# Patient Record
Sex: Male | Born: 1960 | Race: White | Hispanic: No | Marital: Single | Smoking: Current every day smoker
Health system: Southern US, Community
[De-identification: ages and names within clinical notes are randomized; demographics above are authoritative.]

## PROBLEM LIST (undated history)

## (undated) DIAGNOSIS — K353 Acute appendicitis with localized peritonitis, without perforation or gangrene: Secondary | ICD-10-CM

## (undated) DIAGNOSIS — G8929 Other chronic pain: Secondary | ICD-10-CM

## (undated) DIAGNOSIS — M549 Dorsalgia, unspecified: Secondary | ICD-10-CM

## (undated) DIAGNOSIS — I341 Nonrheumatic mitral (valve) prolapse: Secondary | ICD-10-CM

## (undated) DIAGNOSIS — K852 Alcohol induced acute pancreatitis without necrosis or infection: Secondary | ICD-10-CM

## (undated) DIAGNOSIS — F1010 Alcohol abuse, uncomplicated: Secondary | ICD-10-CM

## (undated) HISTORY — DX: Acute appendicitis with localized peritonitis, without perforation or gangrene: K35.30

---

## 2017-06-02 ENCOUNTER — Emergency Department
Admission: EM | Admit: 2017-06-02 | Discharge: 2017-06-02 | Disposition: A | Discharge status: Home / Self Care | Payer: Self-pay | Attending: Emergency Medicine | Admitting: Emergency Medicine

## 2017-06-02 ENCOUNTER — Encounter: Payer: Self-pay | Admitting: Emergency Medicine

## 2017-06-02 ENCOUNTER — Emergency Department: Payer: Self-pay

## 2017-06-02 DIAGNOSIS — Y929 Unspecified place or not applicable: Secondary | ICD-10-CM | POA: Insufficient documentation

## 2017-06-02 DIAGNOSIS — Y998 Other external cause status: Secondary | ICD-10-CM | POA: Insufficient documentation

## 2017-06-02 DIAGNOSIS — Y9355 Activity, bike riding: Secondary | ICD-10-CM | POA: Insufficient documentation

## 2017-06-02 DIAGNOSIS — S4991XA Unspecified injury of right shoulder and upper arm, initial encounter: Secondary | ICD-10-CM | POA: Insufficient documentation

## 2017-06-02 DIAGNOSIS — F17200 Nicotine dependence, unspecified, uncomplicated: Secondary | ICD-10-CM | POA: Insufficient documentation

## 2017-06-02 HISTORY — DX: Dorsalgia, unspecified: M54.9

## 2017-06-02 HISTORY — DX: Other chronic pain: G89.29

## 2017-06-02 HISTORY — DX: Nonrheumatic mitral (valve) prolapse: I34.1

## 2017-06-02 MED ORDER — PREDNISONE 10 MG PO TABS: ORAL_TABLET | ORAL | 0 refills | Status: DC

## 2017-06-02 MED ORDER — KETOROLAC TROMETHAMINE 10 MG PO TABS: 10.0000 mg | ORAL_TABLET | Freq: Four times a day (QID) | ORAL | 0 refills | Status: DC | PRN
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
## 2017-06-18 ENCOUNTER — Emergency Department (HOSPITAL_COMMUNITY)
Admission: EM | Admit: 2017-06-18 | Discharge: 2017-06-19 | Disposition: A | Discharge status: Home / Self Care | Payer: No Typology Code available for payment source | Attending: Emergency Medicine | Admitting: Emergency Medicine

## 2017-06-18 ENCOUNTER — Emergency Department (HOSPITAL_COMMUNITY): Payer: No Typology Code available for payment source

## 2017-06-18 ENCOUNTER — Encounter (HOSPITAL_COMMUNITY): Payer: Self-pay | Admitting: Emergency Medicine

## 2017-06-18 DIAGNOSIS — S022XXA Fracture of nasal bones, initial encounter for closed fracture: Secondary | ICD-10-CM | POA: Diagnosis not present

## 2017-06-18 DIAGNOSIS — Y998 Other external cause status: Secondary | ICD-10-CM | POA: Insufficient documentation

## 2017-06-18 DIAGNOSIS — S0281XA Fracture of other specified skull and facial bones, right side, initial encounter for closed fracture: Secondary | ICD-10-CM | POA: Diagnosis not present

## 2017-06-18 DIAGNOSIS — S0292XA Unspecified fracture of facial bones, initial encounter for closed fracture: Secondary | ICD-10-CM

## 2017-06-18 DIAGNOSIS — S0240CA Maxillary fracture, right side, initial encounter for closed fracture: Secondary | ICD-10-CM | POA: Insufficient documentation

## 2017-06-18 DIAGNOSIS — Y92410 Unspecified street and highway as the place of occurrence of the external cause: Secondary | ICD-10-CM | POA: Diagnosis not present

## 2017-06-18 DIAGNOSIS — Y9389 Activity, other specified: Secondary | ICD-10-CM | POA: Insufficient documentation

## 2017-06-18 DIAGNOSIS — F10120 Alcohol abuse with intoxication, uncomplicated: Secondary | ICD-10-CM | POA: Diagnosis not present

## 2017-06-18 DIAGNOSIS — F17200 Nicotine dependence, unspecified, uncomplicated: Secondary | ICD-10-CM | POA: Insufficient documentation

## 2017-06-18 DIAGNOSIS — S0993XA Unspecified injury of face, initial encounter: Secondary | ICD-10-CM | POA: Diagnosis present

## 2017-06-18 LAB — RAPID URINE DRUG SCREEN, HOSP PERFORMED
AMPHETAMINES: NOT DETECTED
BARBITURATES: NOT DETECTED
Benzodiazepines: NOT DETECTED
Cocaine: NOT DETECTED
Opiates: NOT DETECTED
TETRAHYDROCANNABINOL: NOT DETECTED

## 2017-06-18 LAB — COMPREHENSIVE METABOLIC PANEL
ALBUMIN: 4.4 g/dL (ref 3.5–5.0)
ALK PHOS: 75 U/L (ref 38–126)
ALT: 44 U/L (ref 17–63)
ANION GAP: 12 (ref 5–15)
AST: 67 U/L — AB (ref 15–41)
BUN: <5 mg/dL — ABNORMAL LOW (ref 6–20)
CALCIUM: 8.9 mg/dL (ref 8.9–10.3)
CO2: 21 mmol/L — AB (ref 22–32)
Chloride: 105 mmol/L (ref 101–111)
Creatinine, Ser: 0.68 mg/dL (ref 0.61–1.24)
GFR calc Af Amer: >60 mL/min (ref >60)
GFR calc non Af Amer: >60 mL/min (ref >60)
GLUCOSE: 108 mg/dL — AB (ref 65–99)
POTASSIUM: 3.6 mmol/L (ref 3.5–5.1)
SODIUM: 138 mmol/L (ref 135–145)
Total Bilirubin: 0.7 mg/dL (ref 0.3–1.2)
Total Protein: 7.4 g/dL (ref 6.5–8.1)

## 2017-06-18 LAB — CBC
HCT: 40.7 % (ref 39.0–52.0)
HEMOGLOBIN: 14.1 g/dL (ref 13.0–17.0)
MCH: 33.3 pg (ref 26.0–34.0)
MCHC: 34.6 g/dL (ref 30.0–36.0)
MCV: 96.2 fL (ref 78.0–100.0)
Platelets: 139 10*3/uL — ABNORMAL LOW (ref 150–400)
RBC: 4.23 MIL/uL (ref 4.22–5.81)
RDW: 13.1 % (ref 11.5–15.5)
WBC: 7.7 10*3/uL (ref 4.0–10.5)

## 2017-06-18 LAB — ETHANOL: Alcohol, Ethyl (B): 358 mg/dL (ref <5)
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
## 2017-06-18 NOTE — ED Notes (Signed)
Pt. O2 sat at 89%, placed on cannula on 2L with pulse ox running

## 2017-06-18 NOTE — Discharge Instructions (Signed)
As discussed, it is important to schedule a follow-up visit with our ENT specialist. Additionally important is that you consume alcohol only in moderation.  Return here for concerning changes in your condition.

## 2017-06-18 NOTE — ED Triage Notes (Signed)
Pt is also requesting alcohol detox, pt had two beers today.

## 2017-06-18 NOTE — ED Notes (Signed)
Critical alcohol level 355
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
## 2017-08-04 ENCOUNTER — Encounter
Admission: EM | Disposition: A | Discharge status: Home / Self Care | Payer: Self-pay | Source: Home / Self Care | Attending: Student in an Organized Health Care Education/Training Program

## 2017-08-04 ENCOUNTER — Emergency Department: Payer: Self-pay

## 2017-08-04 ENCOUNTER — Observation Stay
Admission: EM | Admit: 2017-08-04 | Discharge: 2017-08-05 | Disposition: A | Discharge status: Home / Self Care | Payer: Self-pay | Attending: Surgery | Admitting: Surgery

## 2017-08-04 ENCOUNTER — Emergency Department: Payer: Self-pay | Admitting: Certified Registered Nurse Anesthetist

## 2017-08-04 ENCOUNTER — Encounter: Payer: Self-pay | Admitting: Emergency Medicine

## 2017-08-04 DIAGNOSIS — F17210 Nicotine dependence, cigarettes, uncomplicated: Secondary | ICD-10-CM | POA: Insufficient documentation

## 2017-08-04 DIAGNOSIS — I341 Nonrheumatic mitral (valve) prolapse: Secondary | ICD-10-CM | POA: Insufficient documentation

## 2017-08-04 DIAGNOSIS — K353 Acute appendicitis with localized peritonitis, without perforation or gangrene: Secondary | ICD-10-CM

## 2017-08-04 DIAGNOSIS — R1084 Generalized abdominal pain: Secondary | ICD-10-CM | POA: Insufficient documentation

## 2017-08-04 DIAGNOSIS — G8929 Other chronic pain: Secondary | ICD-10-CM | POA: Insufficient documentation

## 2017-08-04 HISTORY — PX: LAPAROSCOPIC APPENDECTOMY: SHX408

## 2017-08-04 HISTORY — DX: Alcohol induced acute pancreatitis without necrosis or infection: K85.20

## 2017-08-04 HISTORY — DX: Alcohol abuse, uncomplicated: F10.10

## 2017-08-04 LAB — CBC
HEMATOCRIT: 41.3 % (ref 40.0–52.0)
HEMOGLOBIN: 14.4 g/dL (ref 13.0–18.0)
MCH: 34.4 pg — ABNORMAL HIGH (ref 26.0–34.0)
MCHC: 34.8 g/dL (ref 32.0–36.0)
MCV: 98.8 fL (ref 80.0–100.0)
Platelets: 214 10*3/uL (ref 150–440)
RBC: 4.18 MIL/uL — ABNORMAL LOW (ref 4.40–5.90)
RDW: 12.9 % (ref 11.5–14.5)
WBC: 14.2 10*3/uL — AB (ref 3.8–10.6)

## 2017-08-04 LAB — COMPREHENSIVE METABOLIC PANEL
ALBUMIN: 4.3 g/dL (ref 3.5–5.0)
ALT: 11 U/L — ABNORMAL LOW (ref 17–63)
ANION GAP: 12 (ref 5–15)
AST: 24 U/L (ref 15–41)
Alkaline Phosphatase: 73 U/L (ref 38–126)
BUN: 9 mg/dL (ref 6–20)
CALCIUM: 9.3 mg/dL (ref 8.9–10.3)
CO2: 20 mmol/L — ABNORMAL LOW (ref 22–32)
Chloride: 103 mmol/L (ref 101–111)
Creatinine, Ser: 0.59 mg/dL — ABNORMAL LOW (ref 0.61–1.24)
GFR calc non Af Amer: >60 mL/min (ref >60)
GLUCOSE: 112 mg/dL — AB (ref 65–99)
POTASSIUM: 3.5 mmol/L (ref 3.5–5.1)
Sodium: 135 mmol/L (ref 135–145)
Total Bilirubin: 1 mg/dL (ref 0.3–1.2)
Total Protein: 7.4 g/dL (ref 6.5–8.1)

## 2017-08-04 LAB — URINALYSIS, COMPLETE (UACMP) WITH MICROSCOPIC
Bacteria, UA: NONE SEEN
Bilirubin Urine: NEGATIVE
GLUCOSE, UA: NEGATIVE mg/dL
HGB URINE DIPSTICK: NEGATIVE
KETONES UR: 5 mg/dL — AB
LEUKOCYTES UA: NEGATIVE
Nitrite: NEGATIVE
PH: 5 (ref 5.0–8.0)
Protein, ur: 30 mg/dL — AB
Specific Gravity, Urine: 1.023 (ref 1.005–1.030)

## 2017-08-04 LAB — LIPASE, BLOOD: LIPASE: 21 U/L (ref 11–51)

## 2017-08-04 SURGERY — APPENDECTOMY, LAPAROSCOPIC
Anesthesia: General | Site: Abdomen | Wound class: Clean Contaminated

## 2017-08-04 MED ORDER — PROMETHAZINE HCL 25 MG/ML IJ SOLN: 6.2500 mg | INTRAMUSCULAR | Status: DC | PRN

## 2017-08-04 MED ORDER — SODIUM CHLORIDE 0.9 % IV BOLUS (SEPSIS)
1000.0000 mL | Freq: Once | INTRAVENOUS | Status: AC
  Administered 2017-08-04: 1000 mL via INTRAVENOUS

## 2017-08-04 MED ORDER — CHLORHEXIDINE GLUCONATE CLOTH 2 % EX PADS: 6.0000 | MEDICATED_PAD | Freq: Once | CUTANEOUS | Status: DC

## 2017-08-04 MED ORDER — BUPIVACAINE HCL (PF) 0.5 % IJ SOLN
INTRAMUSCULAR | Status: AC
  Filled 2017-08-04: qty 30

## 2017-08-04 MED ORDER — PIPERACILLIN-TAZOBACTAM 3.375 G IVPB 30 MIN
INTRAVENOUS | Status: AC
  Administered 2017-08-04: 3.375 g via INTRAVENOUS
  Filled 2017-08-04: qty 50

## 2017-08-04 MED ORDER — LACTATED RINGERS IV SOLN
INTRAVENOUS | Status: DC | PRN
  Administered 2017-08-04: 20:00:00 via INTRAVENOUS

## 2017-08-04 MED ORDER — PROPOFOL 10 MG/ML IV BOLUS
INTRAVENOUS | Status: DC | PRN
  Administered 2017-08-04: 120 mg via INTRAVENOUS

## 2017-08-04 MED ORDER — MEPERIDINE HCL 50 MG/ML IJ SOLN: 6.2500 mg | INTRAMUSCULAR | Status: DC | PRN

## 2017-08-04 MED ORDER — MORPHINE SULFATE (PF) 2 MG/ML IV SOLN
2.0000 mg | INTRAVENOUS | Status: DC | PRN
Start: 2017-08-04 — End: 2017-08-05
  Administered 2017-08-05: 2 mg via INTRAVENOUS
  Filled 2017-08-04: qty 1

## 2017-08-04 MED ORDER — KCL IN DEXTROSE-NACL 20-5-0.45 MEQ/L-%-% IV SOLN
INTRAVENOUS | Status: DC
  Administered 2017-08-04 – 2017-08-05 (×2): via INTRAVENOUS
  Filled 2017-08-04 (×3): qty 1000

## 2017-08-04 MED ORDER — FENTANYL CITRATE (PF) 100 MCG/2ML IJ SOLN
INTRAMUSCULAR | Status: AC
  Filled 2017-08-04: qty 2

## 2017-08-04 MED ORDER — OXYCODONE HCL 5 MG/5ML PO SOLN: 5.0000 mg | Freq: Once | ORAL | Status: DC | PRN

## 2017-08-04 MED ORDER — IOPAMIDOL (ISOVUE-300) INJECTION 61%
75.0000 mL | Freq: Once | INTRAVENOUS | Status: AC | PRN
  Administered 2017-08-04: 75 mL via INTRAVENOUS
  Filled 2017-08-04: qty 75

## 2017-08-04 MED ORDER — LIDOCAINE HCL (CARDIAC) 20 MG/ML IV SOLN
INTRAVENOUS | Status: DC | PRN
Start: 2017-08-04 — End: 2017-08-04
  Administered 2017-08-04: 40 mg via INTRAVENOUS

## 2017-08-04 MED ORDER — ROCURONIUM BROMIDE 100 MG/10ML IV SOLN
INTRAVENOUS | Status: DC | PRN
  Administered 2017-08-04: 10 mg via INTRAVENOUS
  Administered 2017-08-04: 30 mg via INTRAVENOUS

## 2017-08-04 MED ORDER — DEXTROSE 5 % IV SOLN
2.0000 g | INTRAVENOUS | Status: DC
  Administered 2017-08-04: 2 g via INTRAVENOUS
  Filled 2017-08-04 (×2): qty 2

## 2017-08-04 MED ORDER — ACETAMINOPHEN 325 MG PO TABS: 650.0000 mg | ORAL_TABLET | Freq: Four times a day (QID) | ORAL | Status: DC | PRN

## 2017-08-04 MED ORDER — MIDAZOLAM HCL 2 MG/2ML IJ SOLN
INTRAMUSCULAR | Status: AC
  Filled 2017-08-04: qty 2

## 2017-08-04 MED ORDER — FENTANYL CITRATE (PF) 100 MCG/2ML IJ SOLN: 25.0000 ug | INTRAMUSCULAR | Status: DC | PRN

## 2017-08-04 MED ORDER — LIDOCAINE HCL (PF) 1 % IJ SOLN
INTRAMUSCULAR | Status: AC
  Filled 2017-08-04: qty 30

## 2017-08-04 MED ORDER — LIDOCAINE HCL 1 % IJ SOLN
INTRAMUSCULAR | Status: DC | PRN
  Administered 2017-08-04: 18 mL via INTRADERMAL

## 2017-08-04 MED ORDER — ONDANSETRON HCL 4 MG/2ML IJ SOLN: 4.0000 mg | Freq: Four times a day (QID) | INTRAMUSCULAR | Status: DC | PRN

## 2017-08-04 MED ORDER — MIDAZOLAM HCL 2 MG/2ML IJ SOLN
INTRAMUSCULAR | Status: DC | PRN
  Administered 2017-08-04: 2 mg via INTRAVENOUS

## 2017-08-04 MED ORDER — METRONIDAZOLE IN NACL 5-0.79 MG/ML-% IV SOLN
500.0000 mg | Freq: Three times a day (TID) | INTRAVENOUS | Status: DC
  Administered 2017-08-04 – 2017-08-05 (×2): 500 mg via INTRAVENOUS
  Filled 2017-08-04 (×4): qty 100

## 2017-08-04 MED ORDER — FENTANYL CITRATE (PF) 100 MCG/2ML IJ SOLN
INTRAMUSCULAR | Status: DC | PRN
  Administered 2017-08-04 (×2): 50 ug via INTRAVENOUS

## 2017-08-04 MED ORDER — ONDANSETRON HCL 4 MG/2ML IJ SOLN
INTRAMUSCULAR | Status: DC | PRN
  Administered 2017-08-04: 4 mg via INTRAVENOUS

## 2017-08-04 MED ORDER — DEXAMETHASONE SODIUM PHOSPHATE 10 MG/ML IJ SOLN
INTRAMUSCULAR | Status: DC | PRN
  Administered 2017-08-04: 10 mg via INTRAVENOUS

## 2017-08-04 MED ORDER — OXYCODONE HCL 5 MG PO TABS: 5.0000 mg | ORAL_TABLET | Freq: Once | ORAL | Status: DC | PRN

## 2017-08-04 MED ORDER — ALBUTEROL SULFATE HFA 108 (90 BASE) MCG/ACT IN AERS
INHALATION_SPRAY | RESPIRATORY_TRACT | Status: DC | PRN
  Administered 2017-08-04: 4 via RESPIRATORY_TRACT

## 2017-08-04 MED ORDER — ACETAMINOPHEN 650 MG RE SUPP
650.0000 mg | Freq: Four times a day (QID) | RECTAL | Status: DC | PRN
  Filled 2017-08-04: qty 1

## 2017-08-04 MED ORDER — OXYCODONE HCL 5 MG PO TABS: 5.0000 mg | ORAL_TABLET | ORAL | Status: DC | PRN

## 2017-08-04 MED ORDER — KETOROLAC TROMETHAMINE 30 MG/ML IJ SOLN
INTRAMUSCULAR | Status: DC | PRN
  Administered 2017-08-04: 30 mg via INTRAVENOUS

## 2017-08-04 MED ORDER — ONDANSETRON 4 MG PO TBDP: 4.0000 mg | ORAL_TABLET | Freq: Four times a day (QID) | ORAL | Status: DC | PRN

## 2017-08-04 MED ORDER — SUCCINYLCHOLINE CHLORIDE 20 MG/ML IJ SOLN
INTRAMUSCULAR | Status: DC | PRN
  Administered 2017-08-04: 80 mg via INTRAVENOUS

## 2017-08-04 MED ORDER — SUGAMMADEX SODIUM 200 MG/2ML IV SOLN
INTRAVENOUS | Status: DC | PRN
  Administered 2017-08-04: 104.4 mg via INTRAVENOUS

## 2017-08-04 MED ORDER — PIPERACILLIN-TAZOBACTAM 3.375 G IVPB 30 MIN
3.3750 g | Freq: Once | INTRAVENOUS | Status: AC
  Administered 2017-08-04: 3.375 g via INTRAVENOUS

## 2017-08-04 SURGICAL SUPPLY — 41 items
BLADE SURG SZ11 CARB STEEL (BLADE) ×3 IMPLANT
CANISTER SUCT 3000ML PPV (MISCELLANEOUS) ×3 IMPLANT
CHLORAPREP W/TINT 26ML (MISCELLANEOUS) ×3 IMPLANT
CUTTER FLEX LINEAR 45M (STAPLE) ×3 IMPLANT
DECANTER SPIKE VIAL GLASS SM (MISCELLANEOUS) IMPLANT
DEFOGGER SCOPE WARMER CLEARIFY (MISCELLANEOUS) ×3 IMPLANT
DERMABOND ADVANCED (GAUZE/BANDAGES/DRESSINGS) ×2
DERMABOND ADVANCED .7 DNX12 (GAUZE/BANDAGES/DRESSINGS) ×1 IMPLANT
DEVICE TROCAR PUNCTURE CLOSURE (ENDOMECHANICALS) ×3 IMPLANT
ELECT REM PT RETURN 9FT ADLT (ELECTROSURGICAL) ×3
ELECTRODE REM PT RTRN 9FT ADLT (ELECTROSURGICAL) ×1 IMPLANT
FILTER LAP SMOKE EVAC STRL (MISCELLANEOUS) IMPLANT
GLOVE BIO SURGEON STRL SZ7 (GLOVE) ×9 IMPLANT
GLOVE BIOGEL PI IND STRL 7.5 (GLOVE) ×4 IMPLANT
GLOVE BIOGEL PI INDICATOR 7.5 (GLOVE) ×8
GOWN STRL REUS W/ TWL LRG LVL4 (GOWN DISPOSABLE) ×1 IMPLANT
GOWN STRL REUS W/ TWL XL LVL3 (GOWN DISPOSABLE) ×1 IMPLANT
GOWN STRL REUS W/TWL LRG LVL4 (GOWN DISPOSABLE) ×2
GOWN STRL REUS W/TWL XL LVL3 (GOWN DISPOSABLE) ×2
GRASPER SUT TROCAR 14GX15 (MISCELLANEOUS) ×3 IMPLANT
IRRIGATION STRYKERFLOW (MISCELLANEOUS) IMPLANT
IRRIGATOR STRYKERFLOW (MISCELLANEOUS)
KIT RM TURNOVER STRD PROC AR (KITS) ×3 IMPLANT
NEEDLE HYPO 22GX1.5 SAFETY (NEEDLE) ×3 IMPLANT
NEEDLE INSUFFLATION 14GA 120MM (NEEDLE) ×3 IMPLANT
NS IRRIG 1000ML POUR BTL (IV SOLUTION) ×3 IMPLANT
PACK LAP CHOLECYSTECTOMY (MISCELLANEOUS) ×3 IMPLANT
POUCH SPECIMEN RETRIEVAL 10MM (ENDOMECHANICALS) ×3 IMPLANT
RELOAD 45 VASCULAR/THIN (ENDOMECHANICALS) IMPLANT
RELOAD STAPLE TA45 3.5 REG BLU (ENDOMECHANICALS) ×3 IMPLANT
SHEARS HARMONIC ACE PLUS 36CM (ENDOMECHANICALS) ×3 IMPLANT
SLEEVE ENDOPATH XCEL 5M (ENDOMECHANICALS) ×3 IMPLANT
SOL .9 NS 3000ML IRR  AL (IV SOLUTION)
SOL .9 NS 3000ML IRR UROMATIC (IV SOLUTION) IMPLANT
SUT MNCRL AB 4-0 PS2 18 (SUTURE) ×3 IMPLANT
SUT VICRYL 0 UR6 27IN ABS (SUTURE) ×3 IMPLANT
SUT VICRYL AB 3-0 FS1 BRD 27IN (SUTURE) ×3 IMPLANT
TRAY FOLEY W/METER SILVER 16FR (SET/KITS/TRAYS/PACK) ×3 IMPLANT
TROCAR XCEL 12X100 BLDLESS (ENDOMECHANICALS) ×3 IMPLANT
TROCAR XCEL NON-BLD 5MMX100MML (ENDOMECHANICALS) ×3 IMPLANT
TUBING INSUFFLATION (TUBING) ×3 IMPLANT

## 2017-08-04 NOTE — Anesthesia Procedure Notes (Signed)
Procedure Name: Intubation Performed by: Demetrius Charity Pre-anesthesia Checklist: Patient identified, Patient being monitored, Timeout performed, Emergency Drugs available and Suction available Patient Re-evaluated:Patient Re-evaluated prior to induction Oxygen Delivery Method: Circle system utilized Preoxygenation: Pre-oxygenation with 100% oxygen Induction Type: IV induction and Rapid sequence Laryngoscope Size: Mac and 3 Grade View: Grade I Tube type: Oral Tube size: 7.0 mm Number of attempts: 1 Airway Equipment and Method: Stylet Placement Confirmation: ETT inserted through vocal cords under direct vision,  positive ETCO2 and breath sounds checked- equal and bilateral Secured at: 22 cm Tube secured with: Tape Dental Injury: Teeth and Oropharynx as per pre-operative assessment
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
## 2017-08-04 NOTE — ED Notes (Signed)
Consent signed at this time by pt
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
## 2017-08-04 NOTE — Anesthesia Post-op Follow-up Note (Signed)
Anesthesia QCDR form completed.        

## 2017-08-04 NOTE — Op Note (Signed)
SURGICAL OPERATIVE REPORT  DATE OF PROCEDURE: 08/04/2017  ATTENDING Surgeon(s): Ancil Linseyavis, Jason Evan, MD  ANESTHESIA: GETA (General)  PRE-OPERATIVE DIAGNOSIS: Acute non-perforated appendicitis with localized peritonitis (K35.3)  POST-OPERATIVE DIAGNOSIS: Acute non-perforated appendicitis with localized peritonitis (K35.3)  PROCEDURE(S):  1.) Laparoscopic appendectomy (cpt: 44970)  INTRAOPERATIVE FINDINGS: Mildly inflamed non-perforated appendix not surrounded by any ascites  INTRAVENOUS FLUIDS: 800 mL crystalloid   ESTIMATED BLOOD LOSS: Minimal (<20 mL)  URINE OUTPUT: 150 mL   SPECIMENS: Appendix  IMPLANTS: None  DRAINS: None  COMPLICATIONS: None apparent  CONDITION AT END OF PROCEDURE: Hemodynamically stable and extubated  DISPOSITION OF PATIENT: PACU  INDICATIONS FOR PROCEDURE:  Patient is a 56 y.o. male who presented with acute onset of abdominal pain, which patient reports began this morning just above his umbilicus and then worsened and became more focused at his Right UPPER quadrant. He also described nausea with a single episode of small-volume emesis that he says looked like "coffee grounds" to him. Patient otherwise denied any fever/chills, diarrhea, constipation, CP, or SOB, and reported the pain has not been well-controlled in the Emergency Department. All risks, benefits, and alternatives to appendectomy were discussed with the patient, all of patient's questions were answered to his expressed satisfaction, and informed consent was obtained and documented.  DETAILS OF PROCEDURE: Patient was brought to the operating suite and appropriately identified. General anesthesia was administered along with confirmation of appropriate pre-operative antibiotics, and endotracheal intubation was performed by anesthetist, along with NG/OG tube for gastric decompression. In supine position, operative site was prepped and draped in usual sterile fashion, and following a brief time  out, initial 5 mm incision was made in a natural skin crease just below the umbilicus. Fascia was then elevated, and a Verress needle was inserted and its proper position confirmed using saline meniscus test prior to abdominal insufflation.  Upon insufflation of the abdominal cavity with carbon dioxide to a well-tolerated pressure of 12-15 mmHg, a 5 mm peri-umbilical port followed by laparoscope were inserted and used to inspect the abdominal cavity and its contents with no injuries from insertion of the first trochar noted. Two additional trocars were inserted, a 12 mm port at the Left lower quadrant position and another 5 mm port at the suprapubic position. The table was then placed in Trendelenburg position with the Right side up, and blunt graspers were gently used to retract the bowel overlying a clearly inflamed appendix not surrounded any ascites and moderate peri-appendiceal inflammation. The appendix was gently retracted by near its tip, and the base of the appendix and mesoappendix were identified in relation to the cecum. The mesoappendix was dissected from the visceral appendix and hemostasis achieved using a harmonic scalpel. Upon freeing the visceral appendix from the mesoappendix, an endostapler loaded with a standard blue tissue load was advanced across the base of the visceral appendix, which was compressed for several seconds, and the stapler was deployed and removed from the abdominal cavity. Hemostasis was confirmed, and the specimen was extracted from the abdominal cavity in a laparoscopic specimen bag.  The intraperitoneal cavity was inspected with no additional findings. PMI laparoscopic fascial closure device was then used to re-approximate fascia at the 12 mm Left lower quadrant port site. All ports were then removed under direct visualization, and the abdominal cavity was desuflated. All port sites were irrigated/cleaned, additional local anesthetic was injected at each incision, 3-0  Vicryl was used to re-approximate dermis at 10/12 mm port site(s), and subcuticular 4-0 Monocryl suture was used to  re-approximate skin. Skin was then cleaned, dried, and sterile skin glue was applied. Patient was then safely able to be awakened, extubated, and transferred to PACU for post-operative monitoring and care.   I was present for all aspects of procedure, and there were no intra-operative complications apparent.

## 2017-08-04 NOTE — Transfer of Care (Signed)
Immediate Anesthesia Transfer of Care Note  Patient: Alfonso EllisDavid Gandolfo  Procedure(s) Performed: Procedure(s): APPENDECTOMY LAPAROSCOPIC (N/A)  Patient Location: PACU  Anesthesia Type:General  Level of Consciousness: awake, alert  and oriented  Airway & Oxygen Therapy: Patient Spontanous Breathing and Patient connected to face mask oxygen  Post-op Assessment: Report given to RN and Post -op Vital signs reviewed and stable  Post vital signs: Reviewed and stable  Last Vitals:  Vitals:   08/04/17 1614 08/04/17 1909  BP: (!) 148/84 136/82  Pulse: 88 84  Resp: 18 16  Temp: 36.5 C   SpO2: 97% 98%    Last Pain:  Vitals:   08/04/17 1707  TempSrc:   PainSc: 10-Worst pain ever         Complications: No apparent anesthesia complications
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
## 2017-08-04 NOTE — Discharge Instructions (Signed)
In addition to included general post-operative instructions for Laparoscopic Appendectomy,  Diet: Resume home heart healthy diet.   Activity: No heavy lifting >15 pounds (children, pets, laundry, garbage) or strenuous activity until follow-up, but light activity and walking are encouraged. Do not drive or drink alcohol if taking narcotic pain medications.  Wound care: 2 days after surgery (Sunday, 9/2), may shower/get incision wet with soapy water and pat dry (do not rub incisions), but no baths or submerging incision underwater until follow-up.   Medications: Resume all home medications. For mild to moderate pain: acetaminophen (Tylenol) or ibuprofen (if no kidney disease). Combining Tylenol with alcohol can substantially increase your risk of causing liver disease. Narcotic pain medications, if prescribed, can be used for severe pain, though may cause nausea, constipation, and drowsiness. Do not combine Tylenol and Percocet within a 6 hour period as Percocet contains Tylenol. If you do not need the narcotic pain medication, you do not need to fill the prescription.  Call office [phone]) at any time if any questions, worsening pain, fevers/chills, bleeding, drainage from incision site, or other concerns.
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
## 2017-08-05 ENCOUNTER — Encounter: Payer: Self-pay | Admitting: Surgery

## 2017-08-05 MED ORDER — HYDROCODONE-ACETAMINOPHEN 5-300 MG PO TABS: 1.0000 | ORAL_TABLET | ORAL | 0 refills | Status: DC | PRN

## 2017-08-05 NOTE — Anesthesia Postprocedure Evaluation (Signed)
Anesthesia Post Note  Patient: Micheal Medina  Procedure(s) Performed: Procedure(s) (LRB): APPENDECTOMY LAPAROSCOPIC (N/A)  Patient location during evaluation: PACU Anesthesia Type: General Level of consciousness: awake and alert and oriented Pain management: pain level controlled Vital Signs Assessment: post-procedure vital signs reviewed and stable Respiratory status: spontaneous breathing, nonlabored ventilation and respiratory function stable Cardiovascular status: blood pressure returned to baseline and stable Postop Assessment: no signs of nausea or vomiting Anesthetic complications: no     Last Vitals:  Vitals:   08/04/17 2331 08/05/17 0040  BP: 134/79 131/71  Pulse: 83 85  Resp: 18 18  Temp: (!) 36.4 C 36.7 C  SpO2: 100% 100%    Last Pain:  Vitals:   08/05/17 0040  TempSrc: Oral  PainSc:                  [name]

## 2017-08-05 NOTE — Care Management (Signed)
Patient listed as self pay.  Patient discharging on Vicodin.  Patient provided with application to Medication Management and ODC.

## 2017-08-05 NOTE — Progress Notes (Signed)
1 Day Post-Op  Subjective: Status post laparoscopic appendectomy, nonrued. Patient feels well today minimal pain around the larger incision no nausea vomiting.  Objective: Vital signs in last 24 hours: Temp:  [97.5 F (36.4 C)-98.9 F (37.2 C)] 98.7 F (37.1 C) (08/31 0616) Pulse Rate:  [74-110] 74 (08/31 0616) Resp:  [15-20] 20 (08/31 0616) BP: (109-148)/(70-85) 115/74 (08/31 0616) SpO2:  [90 %-100 %] 100 % (08/31 0616) Weight:  [113 lb 9.6 oz (51.5 kg)-115 lb (52.2 kg)] 113 lb 9.6 oz (51.5 kg) (08/30 2256) Last BM Date: 08/04/17  Intake/Output from previous day: 08/30 0701 - 08/31 0700 In: 1751.7 [P.O.:480; I.V.:1221.7; IV Piggyback:50] Out: 705 [Urine:700; Blood:5] Intake/Output this shift: Total I/O In: -  Out: 200 [Urine:200]  Physical exam:  Awake alert and oriented vital signs are reviewed abdomen is soft nondistended nontender wounds are clean with Dermabond.  Lab Results: CBC   Recent Labs  08/04/17 1618  WBC 14.2*  HGB 14.4  HCT 41.3  PLT 214   BMET  Recent Labs  08/04/17 1618  NA 135  K 3.5  CL 103  CO2 20*  GLUCOSE 112*  BUN 9  CREATININE 0.59*  CALCIUM 9.3   PT/INR No results for input(s): LABPROT, INR in the last 72 hours. ABG No results for input(s): PHART, HCO3 in the last 72 hours.  Invalid input(s): PCO2, PO2  Studies/Results: Ct Abdomen Pelvis W Contrast  Result Date: 08/04/2017 CLINICAL DATA:  Generalized abdominal pain EXAM: CT ABDOMEN AND PELVIS WITH CONTRAST TECHNIQUE: Multidetector CT imaging of the abdomen and pelvis was performed using the standard protocol following bolus administration of intravenous contrast. CONTRAST:  75mL ISOVUE-300 IOPAMIDOL (ISOVUE-300) INJECTION 61% COMPARISON:  June 18, 2017 FINDINGS: Lower chest: No acute abnormality. Hepatobiliary: Calcified granuloma. No calcified gallstone or biliary dilatation Pancreas: Unremarkable. No pancreatic ductal dilatation or surrounding inflammatory changes. Spleen:  Normal in size without focal abnormality. Adrenals/Urinary Tract: Adrenal glands are unremarkable. Kidneys are normal, without renal calculi, focal lesion, or hydronephrosis. Bladder is unremarkable. Stomach/Bowel: Stomach nonenlarged. No dilated small bowel. Abnormally enlarged appendix, measuring up to 1 cm with moderate surrounding inflammation and mucosal enhancement. Findings consistent with acute appendicitis. Appendix is retrocecal with ascending orientation, tip visualized adjacent to the inferior tip of the right hepatic lobe. Sigmoid colon diverticula. Vascular/Lymphatic: Aortic atherosclerosis. No enlarged abdominal or pelvic lymph nodes. Reproductive: Prostate is unremarkable. Other: Negative for free air. Mild soft tissue thickening in the right paracolic gutter. Musculoskeletal: No acute or suspicious bone lesion. Transitional anatomy suspected. L1 designated as first non rib-bearing lumbar vertebra. Probable limbus vertebra at L2 and L3. Degenerative changes. IMPRESSION: 1. Findings consistent with acute appendicitis. Appendix is retrocecal with ascending orientation as described above. Negative for free air or abscess Electronically Signed   By: Jasmine Pang M.D.   On: 08/04/2017 18:47    Anti-infectives: Anti-infectives    Start     Dose/Rate Route Frequency Ordered Stop   08/04/17 2257  cefTRIAXone (ROCEPHIN) 2 g in dextrose 5 % 50 mL IVPB     2 g 100 mL/hr over 30 Minutes Intravenous Every 24 hours 08/04/17 2257     08/04/17 2257  metroNIDAZOLE (FLAGYL) IVPB 500 mg     500 mg 100 mL/hr over 60 Minutes Intravenous Every 8 hours 08/04/17 2257     08/04/17 1900  piperacillin-tazobactam (ZOSYN) IVPB 3.375 g     3.375 g 100 mL/hr over 30 Minutes Intravenous  Once 08/04/17 1857 08/04/17 1938  Assessment/Plan: s/p Procedure(s): APPENDECTOMY LAPAROSCOPIC   Patient doing well will advance diet and probably discharge later today.  Lattie Hawichard E Yeriel Mineo, MD, FACS  08/05/2017

## 2017-08-05 NOTE — Progress Notes (Signed)
Patient discharge teaching given, including activity, diet, follow-up appoints, and medications. Patient verbalized understanding of all discharge instructions. IV access was d/c'd. Vitals are stable. Skin is intact except as charted in most recent assessments. Pt to be escorted out by RN, to be driven home by family.  Micheal Medina  

## 2017-08-09 ENCOUNTER — Telehealth: Payer: Self-pay

## 2017-08-09 LAB — SURGICAL PATHOLOGY

## 2017-08-09 NOTE — Telephone Encounter (Signed)
Post-op call made to patient at this time. Spoke with Micheal Medina. Post-op interview questions below.  1. How are you feeling? Having some pain medication  2. Is your pain controlled?   3. What are you doing for the pain? Some but getting around well  4. Are you having any Nausea or Vomiting? none  5. Are you having any Fever or Chills? No  6. Are you having any Constipation or Diarrhea? Having some diarrhea but believes its due to his diet of high sweet intake.  7. Is there any Swelling or Bruising you are concerned about? None  8. Do you have any questions or concerns at this time? None   Discussion: Scheduled patient an appointment for 9/6 with Dr Earlene Plateravis at 10:45. Patient verbalized understanding.

## 2017-08-09 NOTE — Telephone Encounter (Signed)
Post-op call made to patient at this time. Left message for patient to call back to let us know how he is doing as well as to make a follow up appointment.

## 2017-08-11 ENCOUNTER — Encounter: Payer: Self-pay | Admitting: Surgery

## 2017-08-11 ENCOUNTER — Ambulatory Visit (INDEPENDENT_AMBULATORY_CARE_PROVIDER_SITE_OTHER): Payer: Self-pay | Admitting: Surgery

## 2017-08-11 VITALS — BP 115/81 | HR 83 | Temp 97.8°F | Wt 107.0 lb

## 2017-08-11 DIAGNOSIS — K353 Acute appendicitis with localized peritonitis, without perforation or gangrene: Secondary | ICD-10-CM
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
## 2017-08-11 NOTE — Patient Instructions (Signed)
We will refer you to see a Gastroenterologist so he/she could see you for your diarrhea and constipation.  Please call [phone] and they will help you schedule an appointment with a primary care doctor.

## 2017-08-17 ENCOUNTER — Encounter: Payer: Self-pay | Admitting: Gastroenterology

## 2017-09-09 ENCOUNTER — Telehealth: Payer: Self-pay

## 2017-09-09 NOTE — Telephone Encounter (Signed)
I sent myself a staff message to check and see if the patient had been seen by the gastroenterologist. However, I saw that the gastroenterologist office had tried contacting the patient via phone and letter and the patient did not call them back. Maybe the patient does not want to go any longer.

## 2017-09-20 ENCOUNTER — Other Ambulatory Visit: Payer: Self-pay

## 2017-09-20 ENCOUNTER — Ambulatory Visit (INDEPENDENT_AMBULATORY_CARE_PROVIDER_SITE_OTHER): Payer: Self-pay | Admitting: Gastroenterology

## 2017-09-20 ENCOUNTER — Other Ambulatory Visit
Admission: RE | Admit: 2017-09-20 | Discharge: 2017-09-20 | Disposition: A | Discharge status: Home / Self Care | Payer: Self-pay | Source: Ambulatory Visit | Attending: Gastroenterology | Admitting: Gastroenterology

## 2017-09-20 ENCOUNTER — Encounter (INDEPENDENT_AMBULATORY_CARE_PROVIDER_SITE_OTHER): Payer: Self-pay

## 2017-09-20 ENCOUNTER — Encounter: Payer: Self-pay | Admitting: Gastroenterology

## 2017-09-20 VITALS — BP 116/77 | HR 79 | Temp 98.4°F | Ht 66.0 in | Wt 109.0 lb

## 2017-09-20 DIAGNOSIS — K529 Noninfective gastroenteritis and colitis, unspecified: Secondary | ICD-10-CM | POA: Insufficient documentation

## 2017-09-20 DIAGNOSIS — R197 Diarrhea, unspecified: Secondary | ICD-10-CM

## 2017-09-20 LAB — FERRITIN: Ferritin: 52 ng/mL (ref 24–336)

## 2017-09-20 LAB — IRON AND TIBC
Iron: 95 ug/dL (ref 45–182)
SATURATION RATIOS: 24 % (ref 17.9–39.5)
TIBC: 395 ug/dL (ref 250–450)
UIBC: 300 ug/dL

## 2017-09-20 LAB — SEDIMENTATION RATE: Sed Rate: 6 mm/hr (ref 0–20)

## 2017-09-20 LAB — C-REACTIVE PROTEIN

## 2017-09-20 LAB — VITAMIN B12: VITAMIN B 12: 260 pg/mL (ref 180–914)

## 2017-09-20 LAB — TSH: TSH: 1.174 u[IU]/mL (ref 0.350–4.500)
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
## 2017-09-21 LAB — HIV ANTIBODY (ROUTINE TESTING W REFLEX): HIV Screen 4th Generation wRfx: NONREACTIVE

## 2017-09-21 LAB — HEPATITIS PANEL, ACUTE
HCV Ab: <0.1 s/co ratio (ref 0.0–0.9)
Hep A IgM: NEGATIVE
Hep B C IgM: NEGATIVE
Hepatitis B Surface Ag: NEGATIVE

## 2017-09-21 LAB — VITAMIN D 25 HYDROXY (VIT D DEFICIENCY, FRACTURES): VIT D 25 HYDROXY: 29.3 ng/mL — AB (ref 30.0–100.0)

## 2017-09-26 ENCOUNTER — Telehealth: Payer: Self-pay

## 2017-09-26 NOTE — Telephone Encounter (Signed)
Patient has been notified of results: that his blood work results came back normal. You recommended stool studies, as Dr. Allegra LaiVanga does not have an explanation for his diarrhea yet. He said he will have stool studies this week.  Thanks [employer]

## 2017-09-27 ENCOUNTER — Other Ambulatory Visit
Admission: RE | Admit: 2017-09-27 | Discharge: 2017-09-27 | Disposition: A | Discharge status: Home / Self Care | Payer: Self-pay | Source: Ambulatory Visit | Attending: Gastroenterology | Admitting: Gastroenterology

## 2017-09-27 DIAGNOSIS — K529 Noninfective gastroenteritis and colitis, unspecified: Secondary | ICD-10-CM | POA: Insufficient documentation

## 2017-09-27 LAB — GASTROINTESTINAL PANEL BY PCR, STOOL (REPLACES STOOL CULTURE)
ASTROVIRUS: NOT DETECTED
Adenovirus F40/41: NOT DETECTED
Campylobacter species: NOT DETECTED
Cryptosporidium: NOT DETECTED
Cyclospora cayetanensis: NOT DETECTED
ENTAMOEBA HISTOLYTICA: NOT DETECTED
ENTEROAGGREGATIVE E COLI (EAEC): NOT DETECTED
Enteropathogenic E coli (EPEC): NOT DETECTED
Enterotoxigenic E coli (ETEC): NOT DETECTED
GIARDIA LAMBLIA: NOT DETECTED
NOROVIRUS GI/GII: NOT DETECTED
Plesimonas shigelloides: NOT DETECTED
Rotavirus A: NOT DETECTED
SALMONELLA SPECIES: NOT DETECTED
SHIGELLA/ENTEROINVASIVE E COLI (EIEC): NOT DETECTED
Sapovirus (I, II, IV, and V): NOT DETECTED
Shiga like toxin producing E coli (STEC): NOT DETECTED
VIBRIO CHOLERAE: NOT DETECTED
VIBRIO SPECIES: NOT DETECTED
Yersinia enterocolitica: NOT DETECTED

## 2017-10-19 ENCOUNTER — Telehealth: Payer: Self-pay

## 2017-10-19 NOTE — Telephone Encounter (Signed)
Pt has been informed colonoscopy and EGD should take about an hour and once he awakens from the procedure Dr. Allegra Laivanga will speak with him to let him know how it went.  He said he is supposed to report at 10:15am.  I informed him to arrive at the given time that he was told.  Thanks [employer]

## 2017-10-20 ENCOUNTER — Encounter: Payer: Self-pay | Admitting: Anesthesiology

## 2017-10-20 ENCOUNTER — Ambulatory Visit: Payer: Self-pay | Admitting: Anesthesiology

## 2017-10-20 ENCOUNTER — Ambulatory Visit
Admission: RE | Admit: 2017-10-20 | Discharge: 2017-10-20 | Disposition: A | Discharge status: Home / Self Care | Payer: Self-pay | Source: Ambulatory Visit | Attending: Gastroenterology | Admitting: Gastroenterology

## 2017-10-20 ENCOUNTER — Encounter
Admission: RE | Disposition: A | Discharge status: Home / Self Care | Payer: Self-pay | Source: Ambulatory Visit | Attending: Gastroenterology

## 2017-10-20 DIAGNOSIS — K449 Diaphragmatic hernia without obstruction or gangrene: Secondary | ICD-10-CM | POA: Insufficient documentation

## 2017-10-20 DIAGNOSIS — R197 Diarrhea, unspecified: Secondary | ICD-10-CM

## 2017-10-20 DIAGNOSIS — K2950 Unspecified chronic gastritis without bleeding: Secondary | ICD-10-CM | POA: Insufficient documentation

## 2017-10-20 DIAGNOSIS — K648 Other hemorrhoids: Secondary | ICD-10-CM | POA: Insufficient documentation

## 2017-10-20 DIAGNOSIS — F17290 Nicotine dependence, other tobacco product, uncomplicated: Secondary | ICD-10-CM | POA: Insufficient documentation

## 2017-10-20 DIAGNOSIS — K529 Noninfective gastroenteritis and colitis, unspecified: Secondary | ICD-10-CM | POA: Insufficient documentation

## 2017-10-20 DIAGNOSIS — K3189 Other diseases of stomach and duodenum: Secondary | ICD-10-CM

## 2017-10-20 HISTORY — PX: ESOPHAGOGASTRODUODENOSCOPY (EGD) WITH PROPOFOL: SHX5813

## 2017-10-20 HISTORY — PX: COLONOSCOPY WITH PROPOFOL: SHX5780

## 2017-10-20 SURGERY — COLONOSCOPY WITH PROPOFOL
Anesthesia: General

## 2017-10-20 MED ORDER — ONDANSETRON HCL 4 MG/2ML IJ SOLN: 4.0000 mg | Freq: Once | INTRAMUSCULAR | Status: DC | PRN

## 2017-10-20 MED ORDER — LIDOCAINE HCL (PF) 2 % IJ SOLN
INTRAMUSCULAR | Status: AC
  Filled 2017-10-20: qty 10

## 2017-10-20 MED ORDER — LIDOCAINE HCL (CARDIAC) 20 MG/ML IV SOLN
INTRAVENOUS | Status: DC | PRN
  Administered 2017-10-20: 50 mg via INTRAVENOUS

## 2017-10-20 MED ORDER — FENTANYL CITRATE (PF) 100 MCG/2ML IJ SOLN: 25.0000 ug | INTRAMUSCULAR | Status: DC | PRN

## 2017-10-20 MED ORDER — PROPOFOL 500 MG/50ML IV EMUL
INTRAVENOUS | Status: AC
  Filled 2017-10-20: qty 50

## 2017-10-20 MED ORDER — PROPOFOL 10 MG/ML IV BOLUS
INTRAVENOUS | Status: DC | PRN
  Administered 2017-10-20: 50 mg via INTRAVENOUS

## 2017-10-20 MED ORDER — FENTANYL CITRATE (PF) 100 MCG/2ML IJ SOLN
INTRAMUSCULAR | Status: AC
  Filled 2017-10-20: qty 2

## 2017-10-20 MED ORDER — MIDAZOLAM HCL 2 MG/2ML IJ SOLN
INTRAMUSCULAR | Status: DC | PRN
  Administered 2017-10-20: 2 mg via INTRAVENOUS

## 2017-10-20 MED ORDER — SODIUM CHLORIDE 0.9 % IV SOLN
INTRAVENOUS | Status: DC
  Administered 2017-10-20: 1000 mL via INTRAVENOUS
  Administered 2017-10-20: 11:00:00 via INTRAVENOUS

## 2017-10-20 MED ORDER — MIDAZOLAM HCL 2 MG/2ML IJ SOLN
INTRAMUSCULAR | Status: AC
  Filled 2017-10-20: qty 2

## 2017-10-20 MED ORDER — PHENYLEPHRINE HCL 10 MG/ML IJ SOLN
INTRAMUSCULAR | Status: DC | PRN
  Administered 2017-10-20: 200 ug via INTRAVENOUS

## 2017-10-20 MED ORDER — PROPOFOL 500 MG/50ML IV EMUL
INTRAVENOUS | Status: DC | PRN
  Administered 2017-10-20: 150 ug/kg/min via INTRAVENOUS

## 2017-10-20 NOTE — Transfer of Care (Signed)
Immediate Anesthesia Transfer of Care Note  Patient: Alfonso EllisDavid Colla  Procedure(s) Performed: COLONOSCOPY WITH PROPOFOL (N/A ) ESOPHAGOGASTRODUODENOSCOPY (EGD) WITH PROPOFOL (N/A )  Patient Location: Endoscopy Unit  Anesthesia Type:General  Level of Consciousness: drowsy and patient cooperative  Airway & Oxygen Therapy: Patient Spontanous Breathing and Patient connected to nasal cannula oxygen  Post-op Assessment: Report given to RN and Post -op Vital signs reviewed and stable  Post vital signs: Reviewed and stable  Last Vitals:  Vitals:   10/20/17 1011 10/20/17 1135  BP: 124/85   Pulse: (!) 110 70  Resp: 16 20  Temp: (!) 36 C (!) 35.9 C  SpO2: 99% 97%    Last Pain:  Vitals:   10/20/17 1135  TempSrc: Tympanic         Complications: No apparent anesthesia complications

## 2017-10-20 NOTE — Op Note (Signed)
[facility] [place] [facility] Patient Name: Micheal Medina Procedure Date: 10/20/2017 10:51 AM MRN: [id number] Account #: [id number] Date of Birth: 03-24-1961 Admit Type: Outpatient Age: 56 Room: [facility] ENDO ROOM 4 Gender: Male Note Status: Finalized Procedure:            Colonoscopy Indications:          Chronic diarrhea, Clinically significant diarrhea of                        unexplained origin Providers:            Lin Landsman MD, MD Referring MD:         No Local Md, MD (Referring MD) Medicines:            Monitored Anesthesia Care Complications:        No immediate complications. Estimated blood loss: None. Procedure:            Pre-Anesthesia Assessment:                       - Prior to the procedure, a History and Physical was                        performed, and patient medications and allergies were                        reviewed. The patient is competent. The risks and                        benefits of the procedure and the sedation options and                        risks were discussed with the patient. All questions                        were answered and informed consent was obtained.                        Patient identification and proposed procedure were                        verified by the physician, the nurse, the                        anesthesiologist, the anesthetist and the technician in                        the pre-procedure area in the procedure room. Mental                        Status Examination: alert and oriented. Airway                        Examination: normal oropharyngeal airway and neck                        mobility. Respiratory Examination: clear to                        auscultation. CV Examination: normal. Prophylactic  Antibiotics: The patient does not require prophylactic                        antibiotics. Prior Anticoagulants: The patient has                        taken no  previous anticoagulant or antiplatelet agents.                        ASA Grade Assessment: II - A patient with mild systemic                        disease. After reviewing the risks and benefits, the                        patient was deemed in satisfactory condition to undergo                        the procedure. The anesthesia plan was to use monitored                        anesthesia care (MAC). Immediately prior to                        administration of medications, the patient was                        re-assessed for adequacy to receive sedatives. The                        heart rate, respiratory rate, oxygen saturations, blood                        pressure, adequacy of pulmonary ventilation, and                        response to care were monitored throughout the                        procedure. The physical status of the patient was                        re-assessed after the procedure.                       After obtaining informed consent, the colonoscope was                        passed under direct vision. Throughout the procedure,                        the patient's blood pressure, pulse, and oxygen                        saturations were monitored continuously. The                        Colonoscope was introduced through the anus and                        advanced  to the the terminal ileum. The colonoscopy was                        performed without difficulty. The patient tolerated the                        procedure well. The quality of the bowel preparation                        was evaluated using the BBPS [facility] Bowel Preparation                        Scale) with scores of: Right Colon = 3, Transverse                        Colon = 3 and Left Colon = 3 (entire mucosa seen well                        with no residual staining, small fragments of stool or                        opaque liquid). The total BBPS score equals 9. Findings:      The terminal  ileum appeared normal.      Non-bleeding internal hemorrhoids were found during retroflexion. The       hemorrhoids were medium-sized.      The colon (entire examined portion) appeared normal. Biopsies for       histology were taken with a cold forceps from the entire colon for       evaluation of microscopic colitis. Impression:           - The examined portion of the ileum was normal.                       - Non-bleeding internal hemorrhoids.                       - The entire examined colon is normal. Biopsied. Recommendation:       - Discharge patient to home.                       - Resume regular diet today.                       - Continue present medications.                       - Await pathology results.                       - Repeat colonoscopy in 10 years for surveillance.                       - Check pancreatic fecal elastase                       - Take imodium as needed and fiber                       - Return to my office as previously scheduled. Procedure Code(s):    --- Professional ---  45380, Colonoscopy, flexible; with biopsy, single or                        multiple Diagnosis Code(s):    --- Professional ---                       K64.8, Other hemorrhoids                       K52.9, Noninfective gastroenteritis and colitis,                        unspecified                       R19.7, Diarrhea, unspecified CPT copyright 2016 American Medical Association. All rights reserved. The codes documented in this report are preliminary and upon coder review may  be revised to meet current compliance requirements. Dr. Ulyess Mort Lin Landsman MD, MD 10/20/2017 11:34:07 AM This report has been signed electronically. Number of Addenda: 0 Note Initiated On: 10/20/2017 10:51 AM Scope Withdrawal Time: 0 hours 12 minutes 22 seconds  Total Procedure Duration: 0 hours 16 minutes 21 seconds       [facility]

## 2017-10-20 NOTE — Anesthesia Postprocedure Evaluation (Signed)
Anesthesia Post Note  Patient: Micheal EllisDavid Medina  Procedure(s) Performed: COLONOSCOPY WITH PROPOFOL (N/A ) ESOPHAGOGASTRODUODENOSCOPY (EGD) WITH PROPOFOL (N/A )  Patient location during evaluation: PACU Anesthesia Type: General Level of consciousness: awake and alert and oriented Pain management: pain level controlled Vital Signs Assessment: post-procedure vital signs reviewed and stable Respiratory status: spontaneous breathing Cardiovascular status: blood pressure returned to baseline Anesthetic complications: no     Last Vitals:  Vitals:   10/20/17 1155 10/20/17 1205  BP: 99/84 118/87  Pulse:    Resp:    Temp:    SpO2:      Last Pain:  Vitals:   10/20/17 1135  TempSrc: Tympanic                 [name]
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
## 2017-10-20 NOTE — Op Note (Signed)
[facility]astroenterology Patient Name: Micheal Medina Procedure Date: 10/20/2017 10:52 AM MRN: [id number] Account #: [id number] Date of Birth: Oct 15, 1961 Admit Type: Outpatient Age: 56 Room: [facility] ENDO ROOM 4 Gender: Male Note Status: Finalized Procedure:            Upper GI endoscopy Indications:          Diarrhea Providers:            Lin Landsman MD, MD Referring MD:         No Local Md, MD (Referring MD) Medicines:            Monitored Anesthesia Care Complications:        No immediate complications. Estimated blood loss:                        Minimal. Procedure:            Pre-Anesthesia Assessment:                       - Prior to the procedure, a History and Physical was                        performed, and patient medications and allergies were                        reviewed. The patient is competent. The risks and                        benefits of the procedure and the sedation options and                        risks were discussed with the patient. All questions                        were answered and informed consent was obtained.                        Patient identification and proposed procedure were                        verified by the physician, the nurse, the                        anesthesiologist, the anesthetist and the technician in                        the pre-procedure area in the procedure room. Mental                        Status Examination: alert and oriented. Airway                        Examination: normal oropharyngeal airway and neck                        mobility. Respiratory Examination: clear to                        auscultation. CV Examination: normal. Prophylactic  Antibiotics: The patient does not require prophylactic                        antibiotics. Prior Anticoagulants: The patient has                        taken no previous anticoagulant or antiplatelet agents.             ASA Grade Assessment: II - A patient with mild systemic                        disease. After reviewing the risks and benefits, the                        patient was deemed in satisfactory condition to undergo                        the procedure. The anesthesia plan was to use monitored                        anesthesia care (MAC). Immediately prior to                        administration of medications, the patient was                        re-assessed for adequacy to receive sedatives. The                        heart rate, respiratory rate, oxygen saturations, blood                        pressure, adequacy of pulmonary ventilation, and                        response to care were monitored throughout the                        procedure. The physical status of the patient was                        re-assessed after the procedure.                       After obtaining informed consent, the endoscope was                        passed under direct vision. Throughout the procedure,                        the patient's blood pressure, pulse, and oxygen                        saturations were monitored continuously. The                        Colonoscope was introduced through the mouth, and                        advanced to the second part of duodenum. The upper GI  endoscopy was accomplished without difficulty. The                        patient tolerated the procedure well. Findings:      The duodenal bulb and second portion of the duodenum were normal.       Biopsies for histology were taken with a cold forceps for evaluation of       celiac disease.      Multiple dispersed, diminutive non-bleeding erosions were found in the       gastric body, at the incisura and in the gastric antrum. There were       stigmata of recent bleeding. Biopsies were taken with a cold forceps for       Helicobacter pylori testing.      The cardia and gastric fundus were  normal on retroflexion.      A 1 cm hiatal hernia was present.      The gastroesophageal junction and examined esophagus were normal. Impression:           - Normal duodenal bulb and second portion of the                        duodenum. Biopsied.                       - Non-bleeding erosive gastropathy. Biopsied.                       - 1 cm hiatal hernia.                       - Normal gastroesophageal junction and esophagus. Recommendation:       - Await pathology results.                       - Proceed with colonoscopy as scheduled for today                       See colonoscopy report Procedure Code(s):    --- Professional ---                       [phone], Esophagogastroduodenoscopy, flexible, transoral;                        with biopsy, single or multiple Diagnosis Code(s):    --- Professional ---                       K31.89, Other diseases of stomach and duodenum                       K44.9, Diaphragmatic hernia without obstruction or                        gangrene                       R19.7, Diarrhea, unspecified CPT copyright 2016 American Medical Association. All rights reserved. The codes documented in this report are preliminary and upon coder review may  be revised to meet current compliance requirements. Dr. Ulyess Mort Lin Landsman MD, MD 10/20/2017 11:09:16 AM This report has been signed electronically. Number of Addenda: 0 Note Initiated On: 10/20/2017  10:52 AM      [place]

## 2017-10-20 NOTE — Anesthesia Post-op Follow-up Note (Signed)
Anesthesia QCDR form completed.        

## 2017-10-21 ENCOUNTER — Encounter: Payer: Self-pay | Admitting: Gastroenterology

## 2017-10-21 LAB — SURGICAL PATHOLOGY

## 2017-10-25 ENCOUNTER — Telehealth: Payer: Self-pay

## 2017-10-25 NOTE — Telephone Encounter (Signed)
-----   Message from Micheal Reilohini Reddy Vanga, MD sent at 10/25/2017  9:19 AM EST ----- Plz notify pt that his biopsy results came back unremarkable. He need to get stool studies that I ordered and he can pick creon from our office to see if his diarrhea improves  -RV

## 2017-10-25 NOTE — Telephone Encounter (Signed)
LVM on pts answer machine to let him know his biopsy results came back unremarkable and he needs to get stool studies.  Informed him he could come by the office to pick up samples of Creon to see if it helps diarrhea.  Thanks [employer]

## 2017-10-31 ENCOUNTER — Telehealth: Payer: Self-pay | Admitting: Gastroenterology

## 2017-10-31 NOTE — Telephone Encounter (Signed)
Patient returning Michelle's call from 11.20.18 stating he can not bring a stool sample in due to the fact he hasn't had one in several days. Also pt has question regarding samples he needs to pick up.

## 2017-11-01 ENCOUNTER — Other Ambulatory Visit
Admission: RE | Admit: 2017-11-01 | Discharge: 2017-11-01 | Disposition: A | Discharge status: Home / Self Care | Payer: Self-pay | Source: Ambulatory Visit | Attending: Gastroenterology | Admitting: Gastroenterology

## 2017-11-01 DIAGNOSIS — K529 Noninfective gastroenteritis and colitis, unspecified: Secondary | ICD-10-CM | POA: Insufficient documentation

## 2017-11-01 LAB — GASTROINTESTINAL PANEL BY PCR, STOOL (REPLACES STOOL CULTURE)

## 2017-11-02 LAB — PANCREATIC ELASTASE, FECAL: Pancreatic Elastase-1, Stool: 279 ug Elast./g (ref >200)

## 2018-02-15 ENCOUNTER — Other Ambulatory Visit: Payer: Self-pay

## 2018-02-15 ENCOUNTER — Emergency Department: Payer: Self-pay

## 2018-02-15 ENCOUNTER — Emergency Department
Admission: EM | Admit: 2018-02-15 | Discharge: 2018-02-15 | Disposition: A | Discharge status: Home / Self Care | Payer: Self-pay | Attending: Emergency Medicine | Admitting: Emergency Medicine

## 2018-02-15 DIAGNOSIS — F10220 Alcohol dependence with intoxication, uncomplicated: Secondary | ICD-10-CM | POA: Insufficient documentation

## 2018-02-15 DIAGNOSIS — F10920 Alcohol use, unspecified with intoxication, uncomplicated: Secondary | ICD-10-CM

## 2018-02-15 DIAGNOSIS — F17290 Nicotine dependence, other tobacco product, uncomplicated: Secondary | ICD-10-CM | POA: Insufficient documentation

## 2018-02-15 DIAGNOSIS — R0789 Other chest pain: Secondary | ICD-10-CM | POA: Insufficient documentation

## 2018-02-15 LAB — COMPREHENSIVE METABOLIC PANEL
ALK PHOS: 76 U/L (ref 38–126)
ALT: 17 U/L (ref 17–63)
AST: 42 U/L — ABNORMAL HIGH (ref 15–41)
Albumin: 4.7 g/dL (ref 3.5–5.0)
Anion gap: 12 (ref 5–15)
BILIRUBIN TOTAL: 0.5 mg/dL (ref 0.3–1.2)
BUN: 7 mg/dL (ref 6–20)
CALCIUM: 9 mg/dL (ref 8.9–10.3)
CO2: 25 mmol/L (ref 22–32)
CREATININE: 0.68 mg/dL (ref 0.61–1.24)
Chloride: 107 mmol/L (ref 101–111)
Glucose, Bld: 93 mg/dL (ref 65–99)
Potassium: 3.6 mmol/L (ref 3.5–5.1)
Sodium: 144 mmol/L (ref 135–145)
TOTAL PROTEIN: 8.2 g/dL — AB (ref 6.5–8.1)

## 2018-02-15 LAB — CBC WITH DIFFERENTIAL/PLATELET
Basophils Absolute: 0.1 10*3/uL (ref 0–0.1)
Basophils Relative: 1 %
Eosinophils Absolute: 0.3 10*3/uL (ref 0–0.7)
Eosinophils Relative: 4 %
HCT: 47.1 % (ref 40.0–52.0)
HEMOGLOBIN: 16.2 g/dL (ref 13.0–18.0)
LYMPHS ABS: 3.5 10*3/uL (ref 1.0–3.6)
LYMPHS PCT: 45 %
MCH: 35 pg — ABNORMAL HIGH (ref 26.0–34.0)
MCHC: 34.5 g/dL (ref 32.0–36.0)
MCV: 101.6 fL — AB (ref 80.0–100.0)
MONOS PCT: 9 %
Monocytes Absolute: 0.7 10*3/uL (ref 0.2–1.0)
NEUTROS PCT: 41 %
Neutro Abs: 3.3 10*3/uL (ref 1.4–6.5)
Platelets: 188 10*3/uL (ref 150–440)
RBC: 4.64 MIL/uL (ref 4.40–5.90)
RDW: 13 % (ref 11.5–14.5)
WBC: 7.9 10*3/uL (ref 3.8–10.6)

## 2018-02-15 LAB — SALICYLATE LEVEL: Salicylate Lvl: <7 mg/dL (ref 2.8–30.0)

## 2018-02-15 LAB — URINE DRUG SCREEN, QUALITATIVE (ARMC ONLY)
Amphetamines, Ur Screen: NOT DETECTED
Barbiturates, Ur Screen: NOT DETECTED
Benzodiazepine, Ur Scrn: NOT DETECTED
CANNABINOID 50 NG, UR ~~LOC~~: NOT DETECTED
COCAINE METABOLITE, UR ~~LOC~~: NOT DETECTED
MDMA (ECSTASY) UR SCREEN: NOT DETECTED
Methadone Scn, Ur: NOT DETECTED
OPIATE, UR SCREEN: NOT DETECTED
PHENCYCLIDINE (PCP) UR S: NOT DETECTED
TRICYCLIC, UR SCREEN: NOT DETECTED

## 2018-02-15 LAB — ETHANOL: ALCOHOL ETHYL (B): 386 mg/dL — AB (ref <10)

## 2018-02-15 LAB — ACETAMINOPHEN LEVEL

## 2018-02-15 LAB — TROPONIN I: Troponin I: <0.03 ng/mL (ref <0.03)

## 2018-02-15 LAB — LIPASE, BLOOD: Lipase: 47 U/L (ref 11–51)

## 2018-02-15 MED ORDER — VITAMIN B-1 100 MG PO TABS: 100.0000 mg | ORAL_TABLET | Freq: Every day | ORAL | Status: DC

## 2018-02-15 MED ORDER — LORAZEPAM 2 MG PO TABS: 0.0000 mg | ORAL_TABLET | Freq: Four times a day (QID) | ORAL | Status: DC

## 2018-02-15 NOTE — ED Notes (Signed)
BEHAVIORAL HEALTH ROUNDING Patient sleeping: Yes.   Patient alert and oriented: eyes closed  Appears to be asleep Behavior appropriate: Yes.  ; If no, describe:  Nutrition and fluids offered: Yes  Toileting and hygiene offered: sleeping Sitter present: q 15 minute observations and security monitoring Law enforcement present: yes   ENVIRONMENTAL ASSESSMENT Potentially harmful objects out of patient reach: Yes.   Personal belongings secured: Yes.   Patient dressed in hospital provided attire only: Yes.   Plastic bags out of patient reach: Yes.   Patient care equipment (cords, cables, call bells, lines, and drains) shortened, removed, or accounted for: Yes.   Equipment and supplies removed from bottom of stretcher: Yes.   Potentially toxic materials out of patient reach: Yes.   Sharps container removed or out of patient reach: Yes.    Warm blanket provided

## 2018-02-15 NOTE — ED Notes (Signed)
Patient resting with eyes closed. Respirations even and non-labored.

## 2018-02-15 NOTE — ED Triage Notes (Signed)
[place] [facility] [place]. Pt denies any SI or HI or other complaints.

## 2018-02-15 NOTE — ED Notes (Signed)
Patient observed lying in a hallway bed with eyes closed  Even, unlabored respirations observed   NAD pt appears to be sleeping  I will continue to monitor along with every 15 minute visual observations     

## 2018-02-15 NOTE — ED Notes (Signed)
He has ambulated to the BR with a steady gait - I introduced myself  - water provided consumed  Pt to discharge after securing a safe ride home  He denies pain  Alert and oriented at this time  Continue to monitor

## 2018-02-15 NOTE — ED Notes (Signed)
Lab called with critical lab of 386 alc. EDP made aware. No new orders at this time.
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
## 2018-02-15 NOTE — ED Notes (Signed)
Patient currently sitting on side of bed alert and oriented x 4.

## 2018-02-15 NOTE — ED Notes (Signed)
He is his own legal guardian - he is currently using his cellphone to call family for transport home

## 2018-02-15 NOTE — ED Notes (Signed)
Pt checked by security with metal detector prior to going to assigned room.

## 2018-02-15 NOTE — ED Notes (Signed)
Patient placed in 19H. Patient denies SI/HI/AVH. Patient states he just wants some help with detox off alcohol. Patient states he drank part of a 40 oz beer one hour prior to arrival to hospital. Patient states he drinks a one 40 oz beer daily and has done this for over 10 years. Patient [place] [facility] [place] [facility]. Patient denies any seizure activity with detox.

## 2018-02-15 NOTE — Discharge Instructions (Signed)
Please seek help with your alcohol use.  Return to the ER for worsening symptoms, persistent vomiting, difficulty breathing, feelings of hurting yourself or others, or other concerns.

## 2018-02-15 NOTE — ED Notes (Signed)
EDP with patient. 

## 2018-02-15 NOTE — BH Assessment (Signed)
Writer spoke with ER MD Dolores Frame(Sung) and TTS Consult will be discontinued. Pt came in seeking a detox referral but has decided that he no longer wants to go to any facilities due to personal reasons. Pt reports that he does have transportation home and feels safe to return back to his living arrangements.  Pt denies SI/HI A/H V.

## 2018-08-26 ENCOUNTER — Emergency Department (HOSPITAL_COMMUNITY): Payer: Medicaid Other

## 2018-08-26 ENCOUNTER — Inpatient Hospital Stay (HOSPITAL_COMMUNITY)
Admission: EM | Admit: 2018-08-26 | Discharge: 2018-09-22 | DRG: 085 | Disposition: A | Discharge status: Skilled Nursing Facility | Payer: Medicaid Other | Attending: General Surgery | Admitting: General Surgery

## 2018-08-26 ENCOUNTER — Encounter (HOSPITAL_COMMUNITY): Payer: Self-pay | Admitting: Pharmacy Technician

## 2018-08-26 ENCOUNTER — Other Ambulatory Visit: Payer: Self-pay

## 2018-08-26 DIAGNOSIS — S065X0A Traumatic subdural hemorrhage without loss of consciousness, initial encounter: Principal | ICD-10-CM | POA: Diagnosis present

## 2018-08-26 DIAGNOSIS — S066X0A Traumatic subarachnoid hemorrhage without loss of consciousness, initial encounter: Secondary | ICD-10-CM

## 2018-08-26 DIAGNOSIS — R402142 Coma scale, eyes open, spontaneous, at arrival to emergency department: Secondary | ICD-10-CM | POA: Diagnosis present

## 2018-08-26 DIAGNOSIS — S82832A Other fracture of upper and lower end of left fibula, initial encounter for closed fracture: Secondary | ICD-10-CM | POA: Diagnosis present

## 2018-08-26 DIAGNOSIS — T17908A Unspecified foreign body in respiratory tract, part unspecified causing other injury, initial encounter: Secondary | ICD-10-CM

## 2018-08-26 DIAGNOSIS — Z452 Encounter for adjustment and management of vascular access device: Secondary | ICD-10-CM

## 2018-08-26 DIAGNOSIS — L8990 Pressure ulcer of unspecified site, unspecified stage: Secondary | ICD-10-CM

## 2018-08-26 DIAGNOSIS — S020XXA Fracture of vault of skull, initial encounter for closed fracture: Secondary | ICD-10-CM | POA: Diagnosis present

## 2018-08-26 DIAGNOSIS — E440 Moderate protein-calorie malnutrition: Secondary | ICD-10-CM

## 2018-08-26 DIAGNOSIS — Z781 Physical restraint status: Secondary | ICD-10-CM | POA: Diagnosis not present

## 2018-08-26 DIAGNOSIS — E873 Alkalosis: Secondary | ICD-10-CM | POA: Diagnosis not present

## 2018-08-26 DIAGNOSIS — S2242XA Multiple fractures of ribs, left side, initial encounter for closed fracture: Secondary | ICD-10-CM | POA: Diagnosis present

## 2018-08-26 DIAGNOSIS — J9690 Respiratory failure, unspecified, unspecified whether with hypoxia or hypercapnia: Secondary | ICD-10-CM

## 2018-08-26 DIAGNOSIS — F10239 Alcohol dependence with withdrawal, unspecified: Secondary | ICD-10-CM | POA: Diagnosis present

## 2018-08-26 DIAGNOSIS — J9601 Acute respiratory failure with hypoxia: Secondary | ICD-10-CM | POA: Diagnosis present

## 2018-08-26 DIAGNOSIS — J150 Pneumonia due to Klebsiella pneumoniae: Secondary | ICD-10-CM | POA: Diagnosis not present

## 2018-08-26 DIAGNOSIS — E43 Unspecified severe protein-calorie malnutrition: Secondary | ICD-10-CM | POA: Diagnosis present

## 2018-08-26 DIAGNOSIS — R451 Restlessness and agitation: Secondary | ICD-10-CM | POA: Diagnosis not present

## 2018-08-26 DIAGNOSIS — E871 Hypo-osmolality and hyponatremia: Secondary | ICD-10-CM | POA: Diagnosis not present

## 2018-08-26 DIAGNOSIS — S065X9A Traumatic subdural hemorrhage with loss of consciousness of unspecified duration, initial encounter: Secondary | ICD-10-CM | POA: Diagnosis present

## 2018-08-26 DIAGNOSIS — S82409A Unspecified fracture of shaft of unspecified fibula, initial encounter for closed fracture: Secondary | ICD-10-CM

## 2018-08-26 DIAGNOSIS — D62 Acute posthemorrhagic anemia: Secondary | ICD-10-CM | POA: Diagnosis not present

## 2018-08-26 DIAGNOSIS — Z01818 Encounter for other preprocedural examination: Secondary | ICD-10-CM

## 2018-08-26 DIAGNOSIS — F10220 Alcohol dependence with intoxication, uncomplicated: Secondary | ICD-10-CM | POA: Diagnosis present

## 2018-08-26 DIAGNOSIS — S064XAA Epidural hemorrhage with loss of consciousness status unknown, initial encounter: Secondary | ICD-10-CM

## 2018-08-26 DIAGNOSIS — R402352 Coma scale, best motor response, localizes pain, at arrival to emergency department: Secondary | ICD-10-CM | POA: Diagnosis present

## 2018-08-26 DIAGNOSIS — Z681 Body mass index (BMI) 19 or less, adult: Secondary | ICD-10-CM

## 2018-08-26 DIAGNOSIS — R402242 Coma scale, best verbal response, confused conversation, at arrival to emergency department: Secondary | ICD-10-CM | POA: Diagnosis present

## 2018-08-26 DIAGNOSIS — E876 Hypokalemia: Secondary | ICD-10-CM | POA: Diagnosis not present

## 2018-08-26 DIAGNOSIS — J690 Pneumonitis due to inhalation of food and vomit: Secondary | ICD-10-CM | POA: Diagnosis not present

## 2018-08-26 DIAGNOSIS — S065XAA Traumatic subdural hemorrhage with loss of consciousness status unknown, initial encounter: Secondary | ICD-10-CM | POA: Diagnosis present

## 2018-08-26 DIAGNOSIS — S064X9A Epidural hemorrhage with loss of consciousness of unspecified duration, initial encounter: Secondary | ICD-10-CM

## 2018-08-26 LAB — I-STAT ARTERIAL BLOOD GAS, ED
Acid-base deficit: 6 mmol/L — ABNORMAL HIGH (ref 0.0–2.0)
Bicarbonate: 22.8 mmol/L (ref 20.0–28.0)
O2 Saturation: 100 %
PCO2 ART: 58.9 mmHg — AB (ref 32.0–48.0)
PO2 ART: 434 mmHg — AB (ref 83.0–108.0)
TCO2: 25 mmol/L (ref 22–32)
pH, Arterial: 7.195 — CL (ref 7.350–7.450)

## 2018-08-26 LAB — COMPREHENSIVE METABOLIC PANEL
ALT: 63 U/L — ABNORMAL HIGH (ref 0–44)
AST: 140 U/L — AB (ref 15–41)
Albumin: 4.1 g/dL (ref 3.5–5.0)
Alkaline Phosphatase: 68 U/L (ref 38–126)
Anion gap: 14 (ref 5–15)
BILIRUBIN TOTAL: 1.3 mg/dL — AB (ref 0.3–1.2)
BUN: 9 mg/dL (ref 6–20)
CO2: 23 mmol/L (ref 22–32)
CREATININE: 0.67 mg/dL (ref 0.61–1.24)
Calcium: 9.2 mg/dL (ref 8.9–10.3)
Chloride: 101 mmol/L (ref 98–111)
GFR calc Af Amer: >60 mL/min (ref >60)
Glucose, Bld: 106 mg/dL — ABNORMAL HIGH (ref 70–99)
POTASSIUM: 4.5 mmol/L (ref 3.5–5.1)
Sodium: 138 mmol/L (ref 135–145)
TOTAL PROTEIN: 7 g/dL (ref 6.5–8.1)

## 2018-08-26 LAB — ETHANOL: ALCOHOL ETHYL (B): 213 mg/dL — AB (ref <10)

## 2018-08-26 LAB — CBC
HEMATOCRIT: 44.5 % (ref 39.0–52.0)
Hemoglobin: 15.1 g/dL (ref 13.0–17.0)
MCH: 35.4 pg — ABNORMAL HIGH (ref 26.0–34.0)
MCHC: 33.9 g/dL (ref 30.0–36.0)
MCV: 104.2 fL — ABNORMAL HIGH (ref 78.0–100.0)
PLATELETS: 191 10*3/uL (ref 150–400)
RBC: 4.27 MIL/uL (ref 4.22–5.81)
RDW: 12.4 % (ref 11.5–15.5)
WBC: 8.6 10*3/uL (ref 4.0–10.5)

## 2018-08-26 LAB — I-STAT CG4 LACTIC ACID, ED: LACTIC ACID, VENOUS: 2.73 mmol/L — AB (ref 0.5–1.9)

## 2018-08-26 LAB — I-STAT CHEM 8, ED
BUN: 13 mg/dL (ref 6–20)
CALCIUM ION: 1.02 mmol/L — AB (ref 1.15–1.40)
CREATININE: 0.9 mg/dL (ref 0.61–1.24)
Chloride: 104 mmol/L (ref 98–111)
Glucose, Bld: 100 mg/dL — ABNORMAL HIGH (ref 70–99)
HCT: 45 % (ref 39.0–52.0)
Hemoglobin: 15.3 g/dL (ref 13.0–17.0)
Potassium: 4.3 mmol/L (ref 3.5–5.1)
Sodium: 137 mmol/L (ref 135–145)
TCO2: 25 mmol/L (ref 22–32)

## 2018-08-26 LAB — PROTIME-INR
INR: 0.99
Prothrombin Time: 13 seconds (ref 11.4–15.2)

## 2018-08-26 MED ORDER — ACETAMINOPHEN 325 MG PO TABS
650.0000 mg | ORAL_TABLET | ORAL | Status: DC | PRN
  Administered 2018-09-01: 650 mg via ORAL

## 2018-08-26 MED ORDER — FENTANYL 2500MCG IN NS 250ML (10MCG/ML) PREMIX INFUSION
25.0000 ug/h | INTRAVENOUS | Status: DC
  Administered 2018-08-27: 25 ug/h via INTRAVENOUS
  Filled 2018-08-26: qty 250

## 2018-08-26 MED ORDER — ROCURONIUM BROMIDE 50 MG/5ML IV SOLN
INTRAVENOUS | Status: AC | PRN
  Administered 2018-08-26: 100 mg via INTRAVENOUS

## 2018-08-26 MED ORDER — IOHEXOL 300 MG/ML  SOLN
100.0000 mL | Freq: Once | INTRAMUSCULAR | Status: AC | PRN
  Administered 2018-08-26: 100 mL via INTRAVENOUS

## 2018-08-26 MED ORDER — DOCUSATE SODIUM 100 MG PO CAPS: 100.0000 mg | ORAL_CAPSULE | Freq: Two times a day (BID) | ORAL | Status: DC

## 2018-08-26 MED ORDER — ETOMIDATE 2 MG/ML IV SOLN
INTRAVENOUS | Status: AC | PRN
  Administered 2018-08-26: 20 mg via INTRAVENOUS

## 2018-08-26 MED ORDER — MORPHINE SULFATE (PF) 2 MG/ML IV SOLN
2.0000 mg | INTRAVENOUS | Status: DC | PRN
  Administered 2018-08-27: 2 mg via INTRAVENOUS
  Administered 2018-08-28 – 2018-08-29 (×5): 4 mg via INTRAVENOUS
  Filled 2018-08-26: qty 1
  Filled 2018-08-26 (×3): qty 2
  Filled 2018-08-26: qty 1
  Filled 2018-08-26 (×2): qty 2

## 2018-08-26 MED ORDER — PROPOFOL 1000 MG/100ML IV EMUL
0.0000 ug/kg/min | INTRAVENOUS | Status: DC
  Administered 2018-08-27: 30 ug/kg/min via INTRAVENOUS
  Administered 2018-08-27: 25 ug/kg/min via INTRAVENOUS
  Filled 2018-08-26 (×2): qty 100

## 2018-08-26 MED ORDER — FENTANYL CITRATE (PF) 100 MCG/2ML IJ SOLN
INTRAMUSCULAR | Status: AC | PRN
  Administered 2018-08-26: 50 ug via INTRAVENOUS

## 2018-08-26 MED ORDER — FENTANYL BOLUS VIA INFUSION
50.0000 ug | INTRAVENOUS | Status: DC | PRN
  Filled 2018-08-26: qty 50

## 2018-08-26 MED ORDER — FENTANYL CITRATE (PF) 100 MCG/2ML IJ SOLN
INTRAMUSCULAR | Status: AC
  Filled 2018-08-26: qty 2

## 2018-08-26 MED ORDER — PROPOFOL 1000 MG/100ML IV EMUL
INTRAVENOUS | Status: AC
  Filled 2018-08-26: qty 100

## 2018-08-26 MED ORDER — FENTANYL CITRATE (PF) 100 MCG/2ML IJ SOLN: 50.0000 ug | Freq: Once | INTRAMUSCULAR | Status: DC

## 2018-08-26 MED ORDER — PROPOFOL 1000 MG/100ML IV EMUL
INTRAVENOUS | Status: AC | PRN
  Administered 2018-08-26: 10 ug/kg/min via INTRAVENOUS

## 2018-08-26 MED ORDER — SODIUM CHLORIDE 0.9 % IV SOLN
INTRAVENOUS | Status: DC
  Administered 2018-08-27 – 2018-08-30 (×8): via INTRAVENOUS
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
## 2018-08-26 NOTE — ED Notes (Signed)
7.5 et tube 24@the  lip with +color change, bil breath sounds. Pt tolerated well.

## 2018-08-26 NOTE — Progress Notes (Signed)
   08/26/18 2200  Clinical Encounter Type  Visited With Patient  Visit Type Initial  Referral From Nurse  Spiritual Encounters  Spiritual Needs Emotional  Stress Factors  Patient Stress Factors None identified   Responded to Level 2 page. PT was alert with no family present. No contacts at this time. Chaplain offered ministry of presence.
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
## 2018-08-26 NOTE — ED Notes (Signed)
This RN transported pt to CT. 

## 2018-08-26 NOTE — ED Notes (Signed)
MD made aware of pt BP. Propofol to be increased to 7830mcg/kg/min per MD verbal order.

## 2018-08-26 NOTE — Progress Notes (Signed)
RT assisted with intubation. Pt has 7.5 ETT, vent settings of 440 VT, rate 16, FIO2 of 100%, PEEP of 5. Pt tolerating well. ABG in one hour. RT will continue to monitor.

## 2018-08-26 NOTE — ED Notes (Signed)
Propofol to be increased to 5220mcg/kg/min per MD verbal order

## 2018-08-27 ENCOUNTER — Inpatient Hospital Stay (HOSPITAL_COMMUNITY): Payer: Medicaid Other

## 2018-08-27 LAB — PREPARE FRESH FROZEN PLASMA
UNIT DIVISION: 0
Unit division: 0

## 2018-08-27 LAB — TYPE AND SCREEN
ABO/RH(D): O POS
Antibody Screen: NEGATIVE
UNIT DIVISION: 0
Unit division: 0

## 2018-08-27 LAB — GLUCOSE, CAPILLARY
GLUCOSE-CAPILLARY: 117 mg/dL — AB (ref 70–99)
GLUCOSE-CAPILLARY: 136 mg/dL — AB (ref 70–99)

## 2018-08-27 LAB — BPAM FFP
Blood Product Expiration Date: 201910102359
Blood Product Expiration Date: 201910112359
ISSUE DATE / TIME: 201909212215
ISSUE DATE / TIME: 201909212215
Unit Type and Rh: 6200
Unit Type and Rh: 6200

## 2018-08-27 LAB — I-STAT ARTERIAL BLOOD GAS, ED
ACID-BASE DEFICIT: 7 mmol/L — AB (ref 0.0–2.0)
Bicarbonate: 19.4 mmol/L — ABNORMAL LOW (ref 20.0–28.0)
O2 SAT: 90 %
PCO2 ART: 42.9 mmHg (ref 32.0–48.0)
PO2 ART: 67 mmHg — AB (ref 83.0–108.0)
TCO2: 21 mmol/L — AB (ref 22–32)
pH, Arterial: 7.264 — ABNORMAL LOW (ref 7.350–7.450)

## 2018-08-27 LAB — POCT I-STAT 3, ART BLOOD GAS (G3+)
Acid-base deficit: 5 mmol/L — ABNORMAL HIGH (ref 0.0–2.0)
BICARBONATE: 22.4 mmol/L (ref 20.0–28.0)
O2 Saturation: 100 %
TCO2: 24 mmol/L (ref 22–32)
pCO2 arterial: 52.6 mmHg — ABNORMAL HIGH (ref 32.0–48.0)
pH, Arterial: 7.237 — ABNORMAL LOW (ref 7.350–7.450)
pO2, Arterial: 448 mmHg — ABNORMAL HIGH (ref 83.0–108.0)

## 2018-08-27 LAB — CBC
HEMATOCRIT: 38.2 % — AB (ref 39.0–52.0)
Hemoglobin: 12.6 g/dL — ABNORMAL LOW (ref 13.0–17.0)
MCH: 34.6 pg — AB (ref 26.0–34.0)
MCHC: 33 g/dL (ref 30.0–36.0)
MCV: 104.9 fL — AB (ref 78.0–100.0)
PLATELETS: 161 10*3/uL (ref 150–400)
RBC: 3.64 MIL/uL — ABNORMAL LOW (ref 4.22–5.81)
RDW: 12.3 % (ref 11.5–15.5)
WBC: 9.5 10*3/uL (ref 4.0–10.5)

## 2018-08-27 LAB — ABO/RH: ABO/RH(D): O POS

## 2018-08-27 LAB — HIV ANTIBODY (ROUTINE TESTING W REFLEX): HIV Screen 4th Generation wRfx: NONREACTIVE

## 2018-08-27 LAB — BPAM RBC
BLOOD PRODUCT EXPIRATION DATE: 201910192359
Blood Product Expiration Date: 201910192359
ISSUE DATE / TIME: 201909212214
ISSUE DATE / TIME: 201909212214
UNIT TYPE AND RH: 9500
UNIT TYPE AND RH: 9500

## 2018-08-27 LAB — BASIC METABOLIC PANEL
Anion gap: 10 (ref 5–15)
BUN: 7 mg/dL (ref 6–20)
CO2: 21 mmol/L — ABNORMAL LOW (ref 22–32)
CREATININE: 0.58 mg/dL — AB (ref 0.61–1.24)
Calcium: 7.9 mg/dL — ABNORMAL LOW (ref 8.9–10.3)
Chloride: 106 mmol/L (ref 98–111)
GFR calc Af Amer: >60 mL/min (ref >60)
GFR calc non Af Amer: >60 mL/min (ref >60)
GLUCOSE: 133 mg/dL — AB (ref 70–99)
POTASSIUM: 3.6 mmol/L (ref 3.5–5.1)
Sodium: 137 mmol/L (ref 135–145)

## 2018-08-27 LAB — CDS SEROLOGY

## 2018-08-27 LAB — MRSA PCR SCREENING: MRSA by PCR: NEGATIVE

## 2018-08-27 LAB — LACTIC ACID, PLASMA: Lactic Acid, Venous: 1.2 mmol/L (ref 0.5–1.9)

## 2018-08-27 LAB — TRIGLYCERIDES: Triglycerides: 305 mg/dL — ABNORMAL HIGH (ref <150)

## 2018-08-27 MED ORDER — ORAL CARE MOUTH RINSE
15.0000 mL | OROMUCOSAL | Status: DC
  Administered 2018-08-27 – 2018-08-28 (×13): 15 mL via OROMUCOSAL

## 2018-08-27 MED ORDER — CHLORHEXIDINE GLUCONATE 0.12% ORAL RINSE (MEDLINE KIT)
15.0000 mL | Freq: Two times a day (BID) | OROMUCOSAL | Status: DC
  Administered 2018-08-27 – 2018-09-03 (×14): 15 mL via OROMUCOSAL

## 2018-08-27 MED ORDER — DEXMEDETOMIDINE HCL IN NACL 400 MCG/100ML IV SOLN
0.2000 ug/kg/h | INTRAVENOUS | Status: DC
  Administered 2018-08-27: 0.2 ug/kg/h via INTRAVENOUS
  Administered 2018-08-27: 0.5 ug/kg/h via INTRAVENOUS
  Administered 2018-08-28: 0.7 ug/kg/h via INTRAVENOUS
  Administered 2018-08-29: 0.5 ug/kg/h via INTRAVENOUS
  Administered 2018-08-29: 0.7 ug/kg/h via INTRAVENOUS
  Filled 2018-08-27 (×5): qty 100

## 2018-08-27 MED ORDER — FAMOTIDINE IN NACL 20-0.9 MG/50ML-% IV SOLN
20.0000 mg | Freq: Two times a day (BID) | INTRAVENOUS | Status: DC
  Administered 2018-08-27 – 2018-09-07 (×24): 20 mg via INTRAVENOUS
  Filled 2018-08-27 (×24): qty 50

## 2018-08-27 MED ORDER — LORAZEPAM 2 MG/ML IJ SOLN
1.0000 mg | INTRAMUSCULAR | Status: DC | PRN
  Administered 2018-08-27 – 2018-08-29 (×5): 2 mg via INTRAVENOUS
  Filled 2018-08-27 (×5): qty 1

## 2018-08-27 MED ORDER — DOCUSATE SODIUM 50 MG/5ML PO LIQD
100.0000 mg | Freq: Two times a day (BID) | ORAL | Status: DC
  Administered 2018-08-27 – 2018-08-28 (×3): 100 mg
  Filled 2018-08-27 (×4): qty 10
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
## 2018-08-27 NOTE — ED Notes (Signed)
Pt noted to be awake and oxygen saturations in the 80's. Respiratory and MD made aware. Increased o2 percentage from 40-60. MD and RRT at bedside. Repaged Kinsinger.

## 2018-08-27 NOTE — Progress Notes (Signed)
Pt transported to 4N32 without complications

## 2018-08-27 NOTE — Consult Note (Signed)
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 Additionally, there is an 18 x 10 mm acute hemorrhage within the left anterior basal ganglia compatible with shear injury. No midline shift or herniation at this time. Vascular: No hyperdense vessel or unexpected calcification. Skull: Moderate contusion and scalp hematoma in the left lateral frontotemporal region. Nondisplaced left lateral frontal and temporal squama fracture. Other: None. CT MAXILLOFACIAL FINDINGS Osseous: No fracture or mandibular dislocation. No destructive process. Orbits: Negative. No traumatic or inflammatory finding. Sinuses: Low-attenuation partial opacification of the left maxillary sinus and sinus fluid level without associated fracture identified, likely related to paranasal sinus disease. Soft tissues: Mild left facial and periorbital superficial soft tissue thickening compatible with contusion. No large hematoma. CT CERVICAL SPINE FINDINGS Alignment: Normal. Skull base and vertebrae: No acute fracture. No primary bone lesion or focal pathologic process. Soft tissues and spinal canal: No prevertebral fluid or swelling. Retro clival epidural thickening and  increased density compatible with hematoma (series 18 image 34). Disc levels: C5-6 moderate loss of intervertebral disc space height. No high-grade bony canal stenosis. C5-6 uncovertebral hypertrophy encroaches on the neural foramen bilaterally. Upper chest: Negative. Other: Negative. IMPRESSION: CT head: 1. Left lateral frontal bone acute nondisplaced fracture extending into temporal squama. 2. Left lateral frontal acute epidural hematoma subjacent to the fracture measuring 8 mm in thickness. 3. Right cerebral convexity thin 4 mm acute subdural hematoma. 4. Small volume acute subarachnoid hemorrhage predominantly over the right anterior cerebral convexity. 5. Hemorrhagic cortical contusion of the right anterior inferior frontal lobe. 6. 18 mm acute hemorrhage within the left anterior basal ganglia, probably related to diffuse axonal/shear injury.  7. No midline shift or herniation at this time. CT maxillofacial: 1. No acute facial fracture or mandibular dislocation identified. 2. Low-attenuation partial opacification of left maxillary sinus with fluid level probably related to sinus disease. CT cervical spine: 1. Small retroclival epidural hematoma may reflect underlying ligamentous injury. No craniocervical malalignment or widening of the anterior C1-2 joint. 2. No acute fracture or dislocation of the cervical spine. These results were called by telephone at the time of interpretation on 08/26/2018 at 11:38 pm to Dr. Kieth Brightly, who verbally acknowledged these results. Electronically Signed   By: Kristine Garbe M.D.   On: 08/26/2018 23:48    Review of Systems - Negative except per HPI    Blood pressure 106/71, pulse (!) 106, temperature 97.8 F (36.6 C), temperature source Oral, resp. rate (!) 23, height 5' 2.5" (1.588 m), weight 50 kg, SpO2 (!) 80 %. Physical Exam  Assessment/Plan: Patient has multiple cerebral contusions and left frontal EDH.  He is following commands.  Admit with serial neuro  checks per Trauma Service, keep intubated overnight, repeat Head CT in AM.  Lavaun Greenfield D, MD 08/27/2018, 12:09 AM

## 2018-08-27 NOTE — Progress Notes (Signed)
RT called to pt room for pt sats in 80s on the vent. Increased O2 to 100% and PEEP to 10 per MD. Pt sats after adjustments 92%. MD asked for repeat ABG. ABG was obtained and results as follows: Ph 7.26 (previous abg was 7.195) PCO2 42.9 (previous abg was 58.9) HCO3 19.4 PaO2 67 (previous abg was 434)  RT will continue to monitor.

## 2018-08-27 NOTE — Progress Notes (Signed)
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 finding. Sinuses: Low-attenuation partial opacification of the left maxillary sinus and sinus fluid level without associated fracture identified, likely related to paranasal sinus disease. Soft tissues: Mild left facial and periorbital superficial soft tissue thickening compatible with contusion. No large hematoma. CT CERVICAL SPINE FINDINGS Alignment:  Normal. Skull base and vertebrae: No acute fracture. No primary bone lesion or focal pathologic process. Soft tissues and spinal canal: No prevertebral fluid or swelling. Retro clival epidural thickening and increased density compatible with hematoma (series 18 image 34). Disc levels: C5-6 moderate loss of intervertebral disc space height. No high-grade bony canal stenosis. C5-6 uncovertebral hypertrophy encroaches on the neural foramen bilaterally. Upper chest: Negative. Other: Negative. IMPRESSION: CT head: 1. Left lateral frontal bone acute nondisplaced fracture extending into temporal squama. 2. Left lateral frontal acute epidural hematoma subjacent to the fracture measuring 8 mm in thickness. 3. Right cerebral convexity thin 4 mm acute subdural hematoma. 4. Small volume acute subarachnoid hemorrhage predominantly over the right anterior cerebral convexity. 5. Hemorrhagic cortical contusion of the right anterior  inferior frontal lobe. 6. 18 mm acute hemorrhage within the left anterior basal ganglia, probably related to diffuse axonal/shear injury. 7. No midline shift or herniation at this time. CT maxillofacial: 1. No acute facial fracture or mandibular dislocation identified. 2. Low-attenuation partial opacification of left maxillary sinus with fluid level probably related to sinus disease. CT cervical spine: 1. Small retroclival epidural hematoma may reflect underlying ligamentous injury. No craniocervical malalignment or widening of the anterior C1-2 joint. 2. No acute fracture or dislocation of the cervical spine. These results were called by telephone at the time of interpretation on 08/26/2018 at 11:38 pm to Dr. Sheliah Hatch, who verbally acknowledged these results. Electronically Signed   By: Mitzi Hansen M.D.   On: 08/26/2018 23:48    Assessment/Plan: Patient is neurologically stable, as is head CT (expected evolution of SAH, contusions, SDH, EDH).  OK to wean to extubate.  Manage expected ETOH withdrawal.  I will follow patient.    LOS: 1 day    Dorian Heckle, MD 08/27/2018, 9:36 AM

## 2018-08-27 NOTE — Progress Notes (Signed)
Acute change in respiratory status, he had been weaned down to 40% and then 1h later was noted to have desaturated to the mid 80s and placed on 100% and peep increased. Repeat CXR without significant change.  96% now on 100% 10 18 440 Noted belly breathing Does open eyes at times Moves all extremities  Recommend starting fentanyl to suppress drive to avoid fighting the vent, will continue to monitor
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
## 2018-08-28 LAB — CBC WITH DIFFERENTIAL/PLATELET
ABS IMMATURE GRANULOCYTES: 0 10*3/uL (ref 0.0–0.1)
BASOS ABS: 0.1 10*3/uL (ref 0.0–0.1)
BASOS PCT: 1 %
EOS ABS: 0.2 10*3/uL (ref 0.0–0.7)
Eosinophils Relative: 2 %
HCT: 32.2 % — ABNORMAL LOW (ref 39.0–52.0)
Hemoglobin: 10.7 g/dL — ABNORMAL LOW (ref 13.0–17.0)
Immature Granulocytes: 0 %
Lymphocytes Relative: 14 %
Lymphs Abs: 1.2 10*3/uL (ref 0.7–4.0)
MCH: 35 pg — AB (ref 26.0–34.0)
MCHC: 33.2 g/dL (ref 30.0–36.0)
MCV: 105.2 fL — ABNORMAL HIGH (ref 78.0–100.0)
MONO ABS: 0.8 10*3/uL (ref 0.1–1.0)
Monocytes Relative: 9 %
NEUTROS ABS: 6.4 10*3/uL (ref 1.7–7.7)
NEUTROS PCT: 74 %
PLATELETS: 121 10*3/uL — AB (ref 150–400)
RBC: 3.06 MIL/uL — ABNORMAL LOW (ref 4.22–5.81)
RDW: 12 % (ref 11.5–15.5)
WBC: 8.6 10*3/uL (ref 4.0–10.5)

## 2018-08-28 LAB — BLOOD GAS, ARTERIAL
Acid-base deficit: 1.5 mmol/L (ref 0.0–2.0)
Bicarbonate: 22.2 mmol/L (ref 20.0–28.0)
Drawn by: 35849
FIO2: 0.3
Mode: POSITIVE
O2 Saturation: 97.8 %
PATIENT TEMPERATURE: 98.6
PEEP/CPAP: 5 cmH2O
PRESSURE SUPPORT: 5 cmH2O
pCO2 arterial: 34.1 mmHg (ref 32.0–48.0)
pH, Arterial: 7.43 (ref 7.350–7.450)
pO2, Arterial: 91.7 mmHg (ref 83.0–108.0)

## 2018-08-28 LAB — BASIC METABOLIC PANEL
Anion gap: 9 (ref 5–15)
BUN: 13 mg/dL (ref 6–20)
CO2: 23 mmol/L (ref 22–32)
CREATININE: 0.66 mg/dL (ref 0.61–1.24)
Calcium: 8.3 mg/dL — ABNORMAL LOW (ref 8.9–10.3)
Chloride: 107 mmol/L (ref 98–111)
GFR calc Af Amer: >60 mL/min (ref >60)
GFR calc non Af Amer: >60 mL/min (ref >60)
GLUCOSE: 127 mg/dL — AB (ref 70–99)
POTASSIUM: 3.6 mmol/L (ref 3.5–5.1)
SODIUM: 139 mmol/L (ref 135–145)

## 2018-08-28 LAB — BLOOD PRODUCT ORDER (VERBAL) VERIFICATION

## 2018-08-28 MED ORDER — ORAL CARE MOUTH RINSE
15.0000 mL | Freq: Two times a day (BID) | OROMUCOSAL | Status: DC
  Administered 2018-08-28 – 2018-08-29 (×2): 15 mL via OROMUCOSAL

## 2018-08-28 NOTE — Progress Notes (Signed)
Pt at end of bed, attempting to get out of bed. Redirected pt, paged Dr. Cliffton AstersWhite. Verbal order for posey belt. Will continue to closely monitor pt.

## 2018-08-28 NOTE — Progress Notes (Signed)
Follow up - Trauma Critical Care  Patient Details:    Micheal Medina is an 57 y.o. male.  Lines/tubes : Airway 7.5 mm (Active)  Secured at (cm) 23 cm 08/28/2018  5:11 AM  Measured From Lips 08/28/2018  5:11 AM  Secured Location Left 08/28/2018  5:11 AM  Secured By [employer] 08/28/2018  5:11 AM  Tube Holder Repositioned Yes 08/28/2018  5:11 AM  Cuff Pressure (cm H2O) 23 cm H2O 08/27/2018  8:01 PM  Site Condition Dry 08/27/2018 11:46 PM     NG/OG Tube Orogastric Aucultation (Active)  External Length of Tube (cm) - (if applicable) 57 cm 08/27/2018  8:00 PM  Site Assessment Clean;Dry;Intact 08/27/2018  8:00 PM  Ongoing Placement Verification No acute changes, not attributed to clinical condition;Xray 08/27/2018  8:00 PM  Status Suction-low intermittent 08/27/2018  8:00 PM     Urethral Catheter Hermelinda Dellen, RN 16 Fr. (Active)  Indication for Insertion or Continuance of Catheter Acute urinary retention 08/27/2018  8:00 PM  Output (mL) 120 mL 08/28/2018  6:00 AM    Microbiology/Sepsis markers: Results for orders placed or performed during the hospital encounter of 08/26/18  MRSA PCR Screening     Status: None   Collection Time: 08/27/18  2:00 AM  Result Value Ref Range Status   MRSA by PCR NEGATIVE NEGATIVE Final    Comment:        The GeneXpert MRSA Assay (FDA approved for NASAL specimens only), is one component of a comprehensive MRSA colonization surveillance program. It is not intended to diagnose MRSA infection nor to guide or monitor treatment for MRSA infections. Performed at [facility], [address], [place], [place] [address]     Anti-infectives:  Anti-infectives (From admission, onward)   None      Best Practice/Protocols:  VTE Prophylaxis: Mechanical GI Prophylaxis: H2B  Consults: Treatment Team:  Maeola Harman, MD    Studies:    Events:  Subjective:    Overnight Issues: Remains intubated but following commands when sedation held. No  issues overnight per nursing team.  Objective:  Vital signs for last 24 hours: Temp:  [98 F (36.7 C)-101.2 F (38.4 C)] 98.6 F (37 C) (09/23 0318) Pulse Rate:  [27-117] 67 (09/23 0700) Resp:  [14-23] 18 (09/23 0700) BP: (83-130)/(59-84) 122/84 (09/23 0700) SpO2:  [91 %-100 %] 96 % (09/23 0700) FiO2 (%):  [40 %-60 %] 40 % (09/23 0511)  Hemodynamic parameters for last 24 hours:    Intake/Output from previous day: 09/22 0701 - 09/23 0700 In: 1952.2 [I.V.:1879.5; IV Piggyback:72.8] Out: 1445 [Urine:1445]  Intake/Output this shift: No intake/output data recorded.  Vent settings for last 24 hours: Vent Mode: PRVC FiO2 (%):  [40 %-60 %] 40 % Set Rate:  [20 bmp] 20 bmp Vt Set:  [440 mL] 440 mL PEEP:  [5 cmH20-10 cmH20] 5 cmH20 Plateau Pressure:  [14 cmH20-20 cmH20] 14 cmH20  Physical Exam:  General: alert and intubated+sedated Neuro: RASS -2 HEENT/Neck: no JVD, ETT WNL  and PERRL Resp: clear to auscultation bilaterally CVS: regular rate and rhythm, S1, S2 normal, no murmur, click, rub or gallop GI: abdomen soft, nondistended; no ecchymoses Skin: no rash Extremities: no edema, no erythema, pulses WNL  Results for orders placed or performed during the hospital encounter of 08/26/18 (from the past 24 hour(s))  Glucose, capillary     Status: Abnormal   Collection Time: 08/27/18  8:39 AM  Result Value Ref Range   Glucose-Capillary 117 (H) 70 -  99 mg/dL  Lactic acid, plasma     Status: None   Collection Time: 08/27/18 10:18 AM  Result Value Ref Range   Lactic Acid, Venous 1.2 0.5 - 1.9 mmol/L  Glucose, capillary     Status: Abnormal   Collection Time: 08/27/18  8:54 PM  Result Value Ref Range   Glucose-Capillary 136 (H) 70 - 99 mg/dL   Comment 1 Notify RN    Comment 2 Document in Chart     Assessment & Plan: Present on Admission: . SDH (subdural hematoma) (HCC)  NEURO: L Frontal bone fx extending into temporal squama, small retroclival epidural; SAH, contusions,  SDH, EDH - expected evolution on CT head yesterday- Neurosurgery following; Dr. Venetia MaxonStern. Sedation; weaning for possible extubation today  ENT: No active issues; if extubated today, would consider ENT evaluation tomorrow given extension of fx into temporal bone  Pulm: L 4 + 6-8 rib fxs; no ptx; intubated - weaning - on minimal vent settings; possible extubation today; pulmonary toilet with incentive spirometry once extubated. CXR normal yesterday  CV: No active issues; hgb 12.6 yesterday from 15 on admit. Repeat labs pending  GI: NPO; if extubates, plan bedside swallow evaluation with nursing; clears following if passes  Renal: UOP 1.4; Cr normal; follow  Endo: No issues. CBGs appropriate.  ID: temp yesterday at noon 38.4; none since; expected with head injuries that he has. CXR appeared within normal limits. CBC pending. No abx at this time.  PPx: SCDs; H2B; holding chemical dvt ppx until cleared by neurosurgery given EDH/SDH/SAH.  Dispo: ICU today.   LOS: 2 days   Additional comments:I reviewed the patient's new clinical lab test results. CBC/BMP pending; will follow-up. There is no family available at this time to discuss with; will return later to touch base if present  Critical Care Total Time*: 34 minutes  Stephanie Couphristopher M. Cliffton AstersWhite, M.D. [facility]  08/28/2018  *Care during the described time interval was provided by me. I have reviewed this patient's available data, including medical history, events of note, physical examination and test results as part of my evaluation.

## 2018-08-28 NOTE — Procedures (Signed)
Extubation Procedure Note  Patient Details:   Name: Micheal EllisDavid Medina DOB: [birth year] MRN: [id number]   Airway Documentation:    Vent end date: 08/28/18 Vent end time: 1033   Evaluation  O2 sats: stable throughout Complications: No apparent complications Patient did tolerate procedure well. Bilateral Breath Sounds: Clear   Yes   Patient extubated to 2lnc. Patient tolerating well at this time. No complications. RN at bedside. RT will continue to monitor.  Ave Filterdkins, Sydni Elizarraraz Williams 08/28/2018, 10:37 AM

## 2018-08-28 NOTE — Plan of Care (Signed)
Patient wakes up and follows commands, moves all extremities

## 2018-08-29 LAB — CBC WITH DIFFERENTIAL/PLATELET
BASOS PCT: 1 %
Basophils Absolute: 0.1 10*3/uL (ref 0.0–0.1)
EOS ABS: 0.2 10*3/uL (ref 0.0–0.7)
Eosinophils Relative: 2 %
HEMATOCRIT: 31.4 % — AB (ref 39.0–52.0)
Hemoglobin: 10.7 g/dL — ABNORMAL LOW (ref 13.0–17.0)
LYMPHS PCT: 15 %
Lymphs Abs: 1.2 10*3/uL (ref 0.7–4.0)
MCH: 34.7 pg — AB (ref 26.0–34.0)
MCHC: 34.1 g/dL (ref 30.0–36.0)
MCV: 101.9 fL — AB (ref 78.0–100.0)
Monocytes Absolute: 0.9 10*3/uL (ref 0.1–1.0)
Monocytes Relative: 11 %
NEUTROS ABS: 5.4 10*3/uL (ref 1.7–7.7)
Neutrophils Relative %: 71 %
Platelets: 102 10*3/uL — ABNORMAL LOW (ref 150–400)
RBC: 3.08 MIL/uL — ABNORMAL LOW (ref 4.22–5.81)
RDW: 11.7 % (ref 11.5–15.5)
WBC: 7.8 10*3/uL (ref 4.0–10.5)

## 2018-08-29 LAB — URINALYSIS, ROUTINE W REFLEX MICROSCOPIC
BACTERIA UA: NONE SEEN
Bilirubin Urine: NEGATIVE
Glucose, UA: NEGATIVE mg/dL
Ketones, ur: 80 mg/dL — AB
Leukocytes, UA: NEGATIVE
Nitrite: NEGATIVE
PROTEIN: NEGATIVE mg/dL
SPECIFIC GRAVITY, URINE: 1.019 (ref 1.005–1.030)
pH: 6 (ref 5.0–8.0)

## 2018-08-29 LAB — BASIC METABOLIC PANEL
Anion gap: 11 (ref 5–15)
BUN: 8 mg/dL (ref 6–20)
CHLORIDE: 106 mmol/L (ref 98–111)
CO2: 21 mmol/L — AB (ref 22–32)
CREATININE: 0.6 mg/dL — AB (ref 0.61–1.24)
Calcium: 8.4 mg/dL — ABNORMAL LOW (ref 8.9–10.3)
GFR calc Af Amer: >60 mL/min (ref >60)
GFR calc non Af Amer: >60 mL/min (ref >60)
GLUCOSE: 121 mg/dL — AB (ref 70–99)
Potassium: 2.8 mmol/L — ABNORMAL LOW (ref 3.5–5.1)
SODIUM: 138 mmol/L (ref 135–145)

## 2018-08-29 MED ORDER — ADULT MULTIVITAMIN W/MINERALS CH
1.0000 | ORAL_TABLET | Freq: Every day | ORAL | Status: DC
  Administered 2018-08-29 – 2018-09-02 (×5): 1 via ORAL
  Filled 2018-08-29 (×5): qty 1

## 2018-08-29 MED ORDER — LORAZEPAM 1 MG PO TABS: 1.0000 mg | ORAL_TABLET | Freq: Four times a day (QID) | ORAL | Status: AC | PRN

## 2018-08-29 MED ORDER — POTASSIUM CHLORIDE 10 MEQ/100ML IV SOLN
10.0000 meq | INTRAVENOUS | Status: AC
  Administered 2018-08-29 (×4): 10 meq via INTRAVENOUS
  Filled 2018-08-29 (×2): qty 100

## 2018-08-29 MED ORDER — LORAZEPAM 2 MG/ML IJ SOLN
0.0000 mg | Freq: Four times a day (QID) | INTRAMUSCULAR | Status: AC
  Administered 2018-08-29 (×2): 2 mg via INTRAVENOUS
  Administered 2018-08-29 – 2018-08-30 (×2): 1 mg via INTRAVENOUS
  Administered 2018-08-30 (×2): 2 mg via INTRAVENOUS
  Administered 2018-08-31: 1 mg via INTRAVENOUS
  Filled 2018-08-29 (×6): qty 1

## 2018-08-29 MED ORDER — LORAZEPAM 2 MG/ML IJ SOLN
1.0000 mg | Freq: Four times a day (QID) | INTRAMUSCULAR | Status: AC | PRN
  Administered 2018-08-31: 1 mg via INTRAVENOUS
  Filled 2018-08-29 (×2): qty 1

## 2018-08-29 MED ORDER — FOLIC ACID 1 MG PO TABS
1.0000 mg | ORAL_TABLET | Freq: Every day | ORAL | Status: DC
  Administered 2018-08-29 – 2018-09-02 (×5): 1 mg via ORAL
  Filled 2018-08-29 (×5): qty 1

## 2018-08-29 MED ORDER — THIAMINE HCL 100 MG/ML IJ SOLN
100.0000 mg | Freq: Every day | INTRAMUSCULAR | Status: DC
  Administered 2018-08-29 – 2018-09-02 (×4): 100 mg via INTRAVENOUS
  Filled 2018-08-29 (×4): qty 2

## 2018-08-29 MED ORDER — ORAL CARE MOUTH RINSE
15.0000 mL | Freq: Two times a day (BID) | OROMUCOSAL | Status: DC
  Administered 2018-08-29 – 2018-09-02 (×9): 15 mL via OROMUCOSAL

## 2018-08-29 MED ORDER — VITAMIN B-1 100 MG PO TABS
100.0000 mg | ORAL_TABLET | Freq: Every day | ORAL | Status: DC
  Administered 2018-09-01: 100 mg via ORAL
  Filled 2018-08-29: qty 1

## 2018-08-29 MED ORDER — LORAZEPAM 2 MG/ML IJ SOLN
0.0000 mg | Freq: Two times a day (BID) | INTRAMUSCULAR | Status: AC
  Administered 2018-09-01: 2 mg via INTRAVENOUS
  Filled 2018-08-29: qty 1

## 2018-08-29 NOTE — Progress Notes (Signed)
Subjective: Patient reports "My back hurts"  Objective: Vital signs in last 24 hours: Temp:  [98.6 F (37 C)-100.5 F (38.1 C)] 98.8 F (37.1 C) (09/24 0800) Pulse Rate:  [35-123] 35 (09/24 0930) Resp:  [19-36] 26 (09/24 0930) BP: (122-184)/(53-135) 156/89 (09/24 0930) SpO2:  [81 %-99 %] 81 % (09/24 0930)  Intake/Output from previous day: 09/23 0701 - 09/24 0700 In: 2002.8 [I.V.:1876.2; IV Piggyback:126.6] Out: 1240 [Urine:1240] Intake/Output this shift: Total I/O In: 253.8 [I.V.:228.6; IV Piggyback:25.2] Out: 210 [Urine:210]  Restless in bed, assisted by nursing. Follows commands, MAEW. PEARL. Persistent confusion and restlessness requires restraints.   Lab Results: Recent Labs    08/28/18 0800 08/29/18 0322  WBC 8.6 7.8  HGB 10.7* 10.7*  HCT 32.2* 31.4*  PLT 121* 102*   BMET Recent Labs    08/28/18 0800 08/29/18 0322  NA 139 138  K 3.6 2.8*  CL 107 106  CO2 23 21*  GLUCOSE 127* 121*  BUN 13 8  CREATININE 0.66 0.60*  CALCIUM 8.3* 8.4*    Studies/Results: No results found.  Assessment/Plan:   LOS: 3 days  Continue support   Georgiann Cockeroteat, Eugene Zeiders 08/29/2018, 11:11 AM

## 2018-08-29 NOTE — Progress Notes (Signed)
Trauma Service Note  Subjective: Patient doing okay.  Has been verbal with the nurse, but sleeping mostly.  No distress.  Objective: Vital signs in last 24 hours: Temp:  [98.6 F (37 C)-100.5 F (38.1 C)] 98.6 F (37 C) (09/24 0400) Pulse Rate:  [57-96] 62 (09/24 0630) Resp:  [16-36] 22 (09/24 0630) BP: (115-184)/(53-135) 162/90 (09/24 0630) SpO2:  [91 %-99 %] 97 % (09/24 0630) Last BM Date: (PRIOR TO ARRIVAL)  Intake/Output from previous day: 09/23 0701 - 09/24 0700 In: 1920.2 [I.V.:1793.6; IV Piggyback:126.6] Out: 1240 [Urine:1240] Intake/Output this shift: No intake/output data recorded.  General: Would not open eyes and communicate with me, but no distress.  Lots of bruising all over.  Lungs: Clear  Abd: Seems benign  Extremities: Bruising in all extremities  Neuro: Somnolent at the current time.  Lab Results: CBC  Recent Labs    08/28/18 0800 08/29/18 0322  WBC 8.6 7.8  HGB 10.7* 10.7*  HCT 32.2* 31.4*  PLT 121* 102*   BMET Recent Labs    08/28/18 0800 08/29/18 0322  NA 139 138  K 3.6 2.8*  CL 107 106  CO2 23 21*  GLUCOSE 127* 121*  BUN 13 8  CREATININE 0.66 0.60*  CALCIUM 8.3* 8.4*   PT/INR Recent Labs    08/26/18 2149  LABPROT 13.0  INR 0.99   ABG Recent Labs    08/27/18 0351 08/28/18 0915  PHART 7.237* 7.430  HCO3 22.4 22.2    Studies/Results: No results found.  Anti-infectives: Anti-infectives (From admission, onward)   None      Assessment/Plan: s/p  d/c foley TBI team evaluation  LOS: 3 days   Marta LamasJames O. Gae BonWyatt, III, MD, FACS [phone] Trauma Surgeon 08/29/2018
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
## 2018-08-30 MED ORDER — DOCUSATE SODIUM 50 MG/5ML PO LIQD
100.0000 mg | Freq: Two times a day (BID) | ORAL | Status: DC
  Administered 2018-08-30 – 2018-09-02 (×7): 100 mg via ORAL
  Filled 2018-08-30 (×7): qty 10

## 2018-08-30 MED ORDER — CLONAZEPAM 0.5 MG PO TBDP
0.5000 mg | ORAL_TABLET | Freq: Two times a day (BID) | ORAL | Status: DC
  Administered 2018-08-30 – 2018-08-31 (×4): 0.5 mg via ORAL
  Filled 2018-08-30 (×5): qty 1

## 2018-08-30 MED ORDER — QUETIAPINE FUMARATE 100 MG PO TABS
100.0000 mg | ORAL_TABLET | Freq: Two times a day (BID) | ORAL | Status: DC
  Administered 2018-08-30 – 2018-09-02 (×8): 100 mg via ORAL
  Filled 2018-08-30 (×8): qty 1
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
## 2018-08-30 NOTE — Progress Notes (Signed)
Heard IV pump alarm sounding and went into room to assess. Found IV pole-pump lying on the floor sideways. Patient assessed. Channels, lines, drips, IV sites assessed. One channel taken out of circulation. Patient safety mitts applied. Will continue to monitor.
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
## 2018-08-30 NOTE — Progress Notes (Addendum)
Subjective: Patient reports "Micheal Medina. I'm in [name]"  Objective: Vital signs in last 24 hours: Temp:  [98.2 F (36.8 C)-99.3 F (37.4 C)] 99.3 F (37.4 C) (09/25 0800) Pulse Rate:  [35-69] 49 (09/25 0900) Resp:  [16-26] 16 (09/25 0900) BP: (142-169)/(72-135) 156/76 (09/25 0900) SpO2:  [78 %-100 %] 100 % (09/25 0900)  Intake/Output from previous day: 09/24 0701 - 09/25 0700 In: 2357 [I.V.:1856.4; IV Piggyback:500.6] Out: 1035 [Urine:1035] Intake/Output this shift: Total I/O In: 157.1 [I.V.:157.1] Out: -   Awakens to voice. PEARL. Moves extremities to command. Episodes of restlessness and confusion require continued hand mitts  Lab Results: Recent Labs    08/28/18 0800 08/29/18 0322  WBC 8.6 7.8  HGB 10.7* 10.7*  HCT 32.2* 31.4*  PLT 121* 102*   BMET Recent Labs    08/28/18 0800 08/29/18 0322  NA 139 138  K 3.6 2.8*  CL 107 106  CO2 23 21*  GLUCOSE 127* 121*  BUN 13 8  CREATININE 0.66 0.60*  CALCIUM 8.3* 8.4*    Studies/Results: No results found.  Assessment/Plan: improving  LOS: 4 days  Continue support   Georgiann Cockeroteat, Brian 08/30/2018, 9:19 AM  Patient is progressing.  OK for Stepdown.

## 2018-08-30 NOTE — Progress Notes (Signed)
Trauma Service Note  Subjective: Patient asleep this AM, but still on Precedex  Objective: Vital signs in last 24 hours: Temp:  [98.2 F (36.8 C)-99.3 F (37.4 C)] 99.3 F (37.4 C) (09/25 0800) Pulse Rate:  [35-70] 53 (09/25 0800) Resp:  [16-26] 19 (09/25 0800) BP: (142-174)/(72-135) 147/80 (09/25 0800) SpO2:  [78 %-100 %] 100 % (09/25 0800) Last BM Date: (PTA)  Intake/Output from previous day: 09/24 0701 - 09/25 0700 In: 2357 [I.V.:1856.4; IV Piggyback:500.6] Out: 1035 [Urine:1035] Intake/Output this shift: Total I/O In: 76.1 [I.V.:76.1] Out: -   General: No distress, but sedated  Lungs: Clear  Abd: Soft, benign  Extremities: No changes  Neuro: Asleep, getting Precedex preferentially.  Lab Results: CBC  Recent Labs    08/28/18 0800 08/29/18 0322  WBC 8.6 7.8  HGB 10.7* 10.7*  HCT 32.2* 31.4*  PLT 121* 102*   BMET Recent Labs    08/28/18 0800 08/29/18 0322  NA 139 138  K 3.6 2.8*  CL 107 106  CO2 23 21*  GLUCOSE 127* 121*  BUN 13 8  CREATININE 0.66 0.60*  CALCIUM 8.3* 8.4*   PT/INR No results for input(s): LABPROT, INR in the last 72 hours. ABG Recent Labs    08/28/18 0915  PHART 7.430  HCO3 22.2    Studies/Results: No results found.  Anti-infectives: Anti-infectives (From admission, onward)   None      Assessment/Plan: s/p  Try to get off Precedex for today and get the patient transferred to SDU  LOS: 4 days   Marta Lamas. Gae Bon, MD, FACS [phone] Trauma Surgeon 08/30/2018
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
## 2018-08-31 LAB — BASIC METABOLIC PANEL
ANION GAP: 15 (ref 5–15)
BUN: <5 mg/dL — ABNORMAL LOW (ref 6–20)
CALCIUM: 8.4 mg/dL — AB (ref 8.9–10.3)
CO2: 20 mmol/L — ABNORMAL LOW (ref 22–32)
Chloride: 100 mmol/L (ref 98–111)
Creatinine, Ser: 0.59 mg/dL — ABNORMAL LOW (ref 0.61–1.24)
Glucose, Bld: 102 mg/dL — ABNORMAL HIGH (ref 70–99)
POTASSIUM: 2.3 mmol/L — AB (ref 3.5–5.1)
SODIUM: 135 mmol/L (ref 135–145)

## 2018-08-31 MED ORDER — POTASSIUM CHLORIDE 10 MEQ/100ML IV SOLN
10.0000 meq | INTRAVENOUS | Status: AC
  Administered 2018-08-31 (×6): 10 meq via INTRAVENOUS
  Filled 2018-08-31 (×5): qty 100

## 2018-08-31 MED ORDER — POTASSIUM CHLORIDE CRYS ER 20 MEQ PO TBCR
20.0000 meq | EXTENDED_RELEASE_TABLET | Freq: Two times a day (BID) | ORAL | Status: DC
  Filled 2018-08-31: qty 1

## 2018-08-31 MED ORDER — SODIUM CHLORIDE 0.9 % IV SOLN
INTRAVENOUS | Status: DC | PRN
  Administered 2018-08-31: 250 mL via INTRAVENOUS
  Administered 2018-09-03: 500 mL via INTRAVENOUS

## 2018-08-31 MED ORDER — POTASSIUM CHLORIDE 20 MEQ/15ML (10%) PO SOLN
20.0000 meq | Freq: Two times a day (BID) | ORAL | Status: DC
  Administered 2018-08-31 – 2018-09-02 (×5): 20 meq via ORAL
  Filled 2018-08-31 (×6): qty 15

## 2018-08-31 MED ORDER — KCL IN DEXTROSE-NACL 40-5-0.45 MEQ/L-%-% IV SOLN
INTRAVENOUS | Status: DC
  Administered 2018-08-31 – 2018-09-05 (×11): via INTRAVENOUS
  Filled 2018-08-31 (×13): qty 1000

## 2018-08-31 NOTE — Progress Notes (Signed)
Trauma Service Note  Subjective: Patient would answer questions for me today.  Still disoriented  Objective: Vital signs in last 24 hours: Temp:  [98.1 F (36.7 C)-99.2 F (37.3 C)] 98.1 F (36.7 C) (09/26 0400) Pulse Rate:  [49-111] 76 (09/26 0600) Resp:  [15-27] 19 (09/26 0600) BP: (137-173)/(69-145) 144/105 (09/26 0600) SpO2:  [97 %-100 %] 100 % (09/26 0600) Last BM Date: (PTA)  Intake/Output from previous day: 09/25 0701 - 09/26 0700 In: 1971.5 [I.V.:1624.6; IV Piggyback:346.9] Out: 2680 [Urine:2680] Intake/Output this shift: No intake/output data recorded.  General: No distress, but agitated and restrained  Lungs: Clear  Abd: Benign, but has not passed swallowing evaluation.  Extremities: No changes, no clinical signs or symptoms of DVT  Neuro: Disoriented and agitated  Lab Results: CBC  Recent Labs    08/29/18 0322  WBC 7.8  HGB 10.7*  HCT 31.4*  PLT 102*   BMET Recent Labs    08/29/18 0322 08/31/18 0217  NA 138 135  K 2.8* 2.3*  CL 106 100  CO2 21* 20*  GLUCOSE 121* 102*  BUN 8 <5*  CREATININE 0.60* 0.59*  CALCIUM 8.4* 8.4*   PT/INR No results for input(s): LABPROT, INR in the last 72 hours. ABG Recent Labs    08/28/18 0915  PHART 7.430  HCO3 22.2    Studies/Results: No results found.  Anti-infectives: Anti-infectives (From admission, onward)   None      Assessment/Plan: s/p  Hypokalemia a concern, getting replacement  No cleared for food yet, if this persists will have to consider Cortrak tomorrow.  LOS: 5 days   Marta Lamas. Gae Bon, MD, FACS [phone] Trauma Surgeon 08/31/2018

## 2018-08-31 NOTE — Progress Notes (Addendum)
Subjective: Patient states name and location  Objective: Vital signs in last 24 hours: Temp:  [98.1 F (36.7 C)-99.3 F (37.4 C)] 98.1 F (36.7 C) (09/26 0400) Pulse Rate:  [49-111] 76 (09/26 0600) Resp:  [15-27] 19 (09/26 0600) BP: (137-173)/(69-145) 144/105 (09/26 0600) SpO2:  [97 %-100 %] 100 % (09/26 0600)  Intake/Output from previous day: 09/25 0701 - 09/26 0700 In: 1971.5 [I.V.:1624.6; IV Piggyback:346.9] Out: 2680 [Urine:2680] Intake/Output this shift: No intake/output data recorded.  Awake, responds to questions. Moving extremities. Periods of confusion, agitation continue requiring restraints for his safety.  Lab Results: Recent Labs    08/28/18 0800 08/29/18 0322  WBC 8.6 7.8  HGB 10.7* 10.7*  HCT 32.2* 31.4*  PLT 121* 102*   BMET Recent Labs    08/29/18 0322 08/31/18 0217  NA 138 135  K 2.8* 2.3*  CL 106 100  CO2 21* 20*  GLUCOSE 121* 102*  BUN 8 <5*  CREATININE 0.60* 0.59*  CALCIUM 8.4* 8.4*    Studies/Results: No results found.  Assessment/Plan:   LOS: 5 days  Continuing supportive care   [name] 08/31/2018, 7:21 AM   Patient is progressing.

## 2018-08-31 NOTE — Social Work (Signed)
CSW acknowledging consult for SNF placement, pt will be Letter of Guarantee. FL2 done and will send referral out through the hub.   Doy Hutching, LCSWA [facility] Clinical Social Work [phone]
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
## 2018-08-31 NOTE — NC FL2 (Addendum)
[place] MEDICAID FL2 LEVEL OF CARE SCREENING TOOL     IDENTIFICATION  Patient Name: Micheal Medina Birthdate: 1961-05-28 Sex: male Admission Date (Current Location): 08/26/2018  [facility] and [place] Number:  Herbalist and Address:  [facility] [place] [address] [place], [place] [address]      Provider Number: [id number]  Attending Physician Name and Address:  Md, Trauma, MD  Relative Name and Phone Number:  Micheal Medina, son, [phone]    Current Level of Care: Hospital Recommended Level of Care: [place] Prior Approval Number:    Date Approved/Denied:   PASRR Number: [id number]  Discharge Plan: SNF   Current Diagnoses: Patient Active Problem List   Diagnosis Date Noted  . SDH (subdural hematoma) (HCC) 08/26/2018    Orientation RESPIRATION BLADDER Height & Weight     Self  Normal Incontinent, External catheter Weight: 110 lb 3.7 oz (50 kg) Height:  5' 2.5" (158.8 cm)  BEHAVIORAL SYMPTOMS/MOOD NEUROLOGICAL BOWEL NUTRITION STATUS      Incontinent Diet(see discharge summary)  AMBULATORY STATUS COMMUNICATION OF NEEDS Skin   Extensive Assist Verbally(impaired) Surgical wounds, Bruising                       Personal Care Assistance Level of Assistance  Bathing, Feeding, Dressing Bathing Assistance: Maximum assistance Feeding assistance: Maximum assistance Dressing Assistance: Maximum assistance     Functional Limitations Info  Sight, Hearing, Speech Sight Info: Adequate Hearing Info: Adequate Speech Info: Impaired    SPECIAL CARE FACTORS FREQUENCY  PT (By licensed PT), OT (By licensed OT)     PT Frequency: 5x week OT Frequency: 5x week            Contractures Contractures Info: Not present    Additional Factors Info  Code Status, Allergies, Psychotropic Code Status Info: Full Code Allergies Info: No Known Allergies Psychotropic Info: clonazePAM (KLONOPIN) disintegrating tablet 0.5 mg  2x daily; QUEtiapine (SEROQUEL) tablet 100 mg 2x daily         Current Medications (08/31/2018):  This is the current hospital active medication list Current Facility-Administered Medications  Medication Dose Route Frequency Provider Last Rate Last Dose  . 0.9 %  sodium chloride infusion   Intravenous PRN Judeth Horn, MD 10 mL/hr at 08/31/18 1400    . acetaminophen (TYLENOL) tablet 650 mg  650 mg Oral Q4H PRN Judeth Horn, MD      . chlorhexidine gluconate (MEDLINE KIT) (PERIDEX) 0.12 % solution 15 mL  15 mL Mouth Rinse BID Judeth Horn, MD   15 mL at 08/31/18 0733  . clonazePAM (KLONOPIN) disintegrating tablet 0.5 mg  0.5 mg Oral BID Judeth Horn, MD   0.5 mg at 08/31/18 0932  . dextrose 5 % and 0.45 % NaCl with KCl 40 mEq/L infusion   Intravenous Continuous Judeth Horn, MD 100 mL/hr at 08/31/18 1414    . docusate (COLACE) 50 MG/5ML liquid 100 mg  100 mg Oral BID Judeth Horn, MD   100 mg at 08/31/18 0935  . famotidine (PEPCID) IVPB 20 mg premix  20 mg Intravenous Q12H Judeth Horn, MD   Stopped at 08/31/18 1002  . folic acid (FOLVITE) tablet 1 mg  1 mg Oral Daily Judeth Horn, MD   1 mg at 08/31/18 0936  . LORazepam (ATIVAN) injection 0-4 mg  0-4 mg Intravenous Q12H Judeth Horn, MD      . LORazepam (ATIVAN) tablet 1 mg  1 mg Oral Q6H PRN  Judeth Horn, MD       Or  . LORazepam (ATIVAN) injection 1 mg  1 mg Intravenous Q6H PRN Judeth Horn, MD   1 mg at 08/31/18 0214  . MEDLINE mouth rinse  15 mL Mouth Rinse BID Judeth Horn, MD   15 mL at 08/31/18 0935  . morphine 2 MG/ML injection 2-4 mg  2-4 mg Intravenous Q1H PRN Judeth Horn, MD   4 mg at 08/29/18 1421  . multivitamin with minerals tablet 1 tablet  1 tablet Oral Daily Judeth Horn, MD   1 tablet at 08/31/18 0932  . potassium chloride 20 MEQ/15ML (10%) solution 20 mEq  20 mEq Oral BID Erline Levine, MD      . QUEtiapine (SEROQUEL) tablet 100 mg  100 mg Oral BID Judeth Horn, MD   100 mg at 08/31/18 0934  . thiamine (VITAMIN B-1) tablet  100 mg  100 mg Oral Daily Judeth Horn, MD       Or  . thiamine (B-1) injection 100 mg  100 mg Intravenous Daily Judeth Horn, MD   100 mg at 08/31/18 0932     Discharge Medications: Please see discharge summary for a list of discharge medications.  Relevant Imaging Results:  Relevant Lab Results:   Additional Information [id number] [place], [place]

## 2018-08-31 NOTE — Progress Notes (Signed)
CRITICAL VALUE ALERT  Critical Value:  Potassium 2.3  Date & Time Notied:  08/31/18 0345  Provider Notified: Dr. Dwain Sarna  Orders Received/Actions taken: New orders received. 60 mEq potassium IV over 6 hours; basic metabolic panel following.

## 2018-09-01 ENCOUNTER — Inpatient Hospital Stay (HOSPITAL_COMMUNITY): Payer: Medicaid Other

## 2018-09-01 DIAGNOSIS — E440 Moderate protein-calorie malnutrition: Secondary | ICD-10-CM

## 2018-09-01 LAB — BASIC METABOLIC PANEL
ANION GAP: 10 (ref 5–15)
BUN: <5 mg/dL — ABNORMAL LOW (ref 6–20)
CO2: 23 mmol/L (ref 22–32)
Calcium: 8.6 mg/dL — ABNORMAL LOW (ref 8.9–10.3)
Chloride: 99 mmol/L (ref 98–111)
Creatinine, Ser: 0.46 mg/dL — ABNORMAL LOW (ref 0.61–1.24)
GLUCOSE: 138 mg/dL — AB (ref 70–99)
POTASSIUM: 2.8 mmol/L — AB (ref 3.5–5.1)
Sodium: 132 mmol/L — ABNORMAL LOW (ref 135–145)

## 2018-09-01 LAB — GLUCOSE, CAPILLARY: GLUCOSE-CAPILLARY: 142 mg/dL — AB (ref 70–99)

## 2018-09-01 MED ORDER — IBUPROFEN 200 MG PO TABS: 800.0000 mg | ORAL_TABLET | Freq: Three times a day (TID) | ORAL | Status: DC | PRN

## 2018-09-01 MED ORDER — JEVITY 1.5 CAL/FIBER PO LIQD
1000.0000 mL | ORAL | Status: DC
  Administered 2018-09-05: 1000 mL
  Filled 2018-09-01 (×6): qty 1000

## 2018-09-01 MED ORDER — POTASSIUM CHLORIDE 10 MEQ/50ML IV SOLN: 10.0000 meq | INTRAVENOUS | Status: DC

## 2018-09-01 MED ORDER — CLONAZEPAM 0.125 MG PO TBDP
0.2500 mg | ORAL_TABLET | Freq: Two times a day (BID) | ORAL | Status: DC
  Administered 2018-09-01 – 2018-09-02 (×4): 0.25 mg via ORAL
  Filled 2018-09-01 (×3): qty 1

## 2018-09-01 MED ORDER — JEVITY 1.2 CAL PO LIQD: 1000.0000 mL | ORAL | Status: DC

## 2018-09-01 MED ORDER — POTASSIUM CHLORIDE 10 MEQ/100ML IV SOLN
10.0000 meq | INTRAVENOUS | Status: AC
  Administered 2018-09-01 (×6): 10 meq via INTRAVENOUS
  Filled 2018-09-01 (×5): qty 100
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
## 2018-09-01 NOTE — Progress Notes (Signed)
Cortrak Tube Team Note:  Consult received to place a Cortrak feeding tube.   A 10 F Cortrak tube was placed in the R nare and secured with a nasal tape and clip at 66 cm. Per the Cortrak monitor reading the tube tip is in the stomach.   No x-ray is required. RN may begin using tube.    If the tube becomes dislodged please keep the tube and contact the Cortrak team at [email or web address] (password [email or web address]) for replacement.  If after hours and replacement cannot be delayed, place a NG tube and confirm placement with an abdominal x-ray.     Trenton Gammon, MS, RD, LDN, [facility] Inpatient Clinical Dietitian Pager # [phone] After hours/weekend pager # [phone]

## 2018-09-01 NOTE — Progress Notes (Addendum)
Subjective: Patient  answers questions slowly but appropriately  Objective: Vital signs in last 24 hours: Temp:  [97.3 F (36.3 C)-99.4 F (37.4 C)] 98 F (36.7 C) (09/27 0355) Pulse Rate:  [67-104] 73 (09/27 0355) Resp:  [19-29] 19 (09/27 0355) BP: (140-169)/(83-113) 169/83 (09/27 0355) SpO2:  [96 %-100 %] 97 % (09/27 0355)  Intake/Output from previous day: 09/26 0701 - 09/27 0700 In: 1958.8 [I.V.:1505.3; IV Piggyback:453.6] Out: 1300 [Urine:1300] Intake/Output this shift: No intake/output data recorded.  Awake. Answers questions slowly in a whisper, requiring encouragement. Moves extremities to command. Confusion requires restraints for his safety.  Lab Results: No results for input(s): WBC, HGB, HCT, PLT in the last 72 hours. BMET Recent Labs    08/31/18 0217 09/01/18 0229  NA 135 132*  K 2.3* 2.8*  CL 100 99  CO2 20* 23  GLUCOSE 102* 138*  BUN <5* <5*  CREATININE 0.59* 0.46*  CALCIUM 8.4* 8.6*    Studies/Results: No results found.  Assessment/Plan:   LOS: 6 days  Supportiive care continues.   [name] 09/01/2018, 7:36 AM   Continue with current management

## 2018-09-01 NOTE — Discharge Instructions (Signed)
Living With Traumatic Brain Injury °Traumatic brain injury (TBI) is an injury to the brain that may be mild, moderate, or severe. Symptoms of any type of TBI can be long lasting (chronic). Depending on the area of the brain that is affected, a TBI can interfere with vision, memory, concentration, speech, balance, sense of touch, and sleep. TBI can also cause chronic symptoms like headache or dizziness. °How to cope with lifestyle changes °After a TBI, you may need to make changes to your lifestyle in order to recover as well as possible. How quickly and how fully you recover will depend on the severity of your injury. Your recovery plan may involve: °· Working with specialists to develop a rehabilitation plan to help you return to your regular activities. Your health care team may include: °? Physical or occupational therapists. °? Speech and language pathologists. °? Mental health counselors. °? Physicians like your primary care physician or neurologist. °· Taking time off work or school, depending on your injury. °· Avoiding situations where there is a risk for another head injury, such as football, hockey, soccer, basketball, martial arts, downhill snow sports, and horseback riding. Do not do these activities until your health care provider approves. °· Resting. Rest helps the brain to heal. Make sure you: °? Get plenty of sleep at night. Avoid staying up late at night. °? Keep the same bedtime hours on weekends and weekdays. °? Rest during the day. Take daytime naps or rest breaks when you feel tired. °· Avoiding extra stress on your eyes. You may need to set time limits when working on the computer, watching TV, and reading. °· Finding ways to manage stress. This may include: °? Avoiding activities that cause stress. °? Deep breathing, yoga, or meditation. °? Listening to music or spending time outdoors. °· Making lists, setting reminders, or using a day planner to help your memory. °· Allowing yourself plenty  of time to complete everyday tasks, such as grocery shopping, paying bills, and doing laundry. °· Avoiding driving. Your ability to drive safely may be affected by your injury. °? Rely on family, friends, or a transportation service to help you get around and to appointments. °? Have a professional evaluation to check your driving ability. °? Access support services to help you return to driving. These may include training and adaptive equipment. ° °Follow these instructions at home: °· Take over-the-counter and prescription medicines only as told by your health care provider. Do not take aspirin or other anti-inflammatory medicines such as ibuprofen or naproxen unless approved by your health care provider. °· Avoid large amounts of caffeine. Your body may be more sensitive to it after your injury. °· Do not use any products that contain nicotine or tobacco, such as cigarettes, e-cigarettes, nicotine gum, and patches. If you need help quitting, ask your health care provider. °· Do not use drugs. °· Limit alcohol intake to no more than 1 drink per day for nonpregnant women and 2 drinks per day for men. One drink equals 12 ounces of beer, 5 ounces of wine, or 1½ ounces of hard liquor. °· Do not drive until cleared by your health care provider. °· Keep all follow-up visits as told by your health care provider. This is important. °Where to find support: °· Talk with your employer, co-workers, teachers, or school counselor about your injury. Work together to develop a plan for completing tasks while you recover. °· Talk to others living with a TBI. Join a support group with other   people who have experienced a TBI. °· Let your friends and family members know what they can do to help. This might include helping at home or transportation to appointments. °· If you are unable to continue working after your injury, talk to a social worker about options to help you meet your financial needs. °· Seek out additional resources if  you are a military serviceman or family member, such as: °? [facility]: dvbic.dcoe.mil °? [facility] [employer] Military and Veterans Crisis Line: 1-800-273-8255 °Questions to ask your health care provider: °· How serious is my injury? °· What is my rehabilitation plan? °· What is my expected recovery? °· When can I return to work or school? °· When can I return to regular activities, including driving? °Contact a health care provider if: °· You have new or worsening: °? Dizziness. °? Headache. °? Anxiety or depression. °? Irritability. °? Confusion. °? Jerky movements that you cannot control (seizures). °? Extreme sensitivity to light or sound. °? Nausea or vomiting. °Summary °· Traumatic brain injury (TBI) is an injury to your brain that can interfere with vision, memory, concentration, speech, balance, sense of touch, and sleep. TBI can also cause chronic symptoms like headache or dizziness. °· After a TBI you may need to make several changes to your lifestyle in order to recover as well as possible. How quickly and how fully you recover will depend on the severity of your injury. °· Talk to your family, friends, employer, co-workers, teachers, or school counselor about your injury. Work together to develop a plan for completing tasks while you recover. °This information is not intended to replace advice given to you by your health care provider. Make sure you discuss any questions you have with your health care provider. °Document Released: 11/18/2016 Document Revised: 11/18/2016 Document Reviewed: 11/18/2016 °Elsevier Interactive Patient Education © 2018 [employer] ° °
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
## 2018-09-02 LAB — GLUCOSE, CAPILLARY
GLUCOSE-CAPILLARY: 140 mg/dL — AB (ref 70–99)
Glucose-Capillary: 136 mg/dL — ABNORMAL HIGH (ref 70–99)
Glucose-Capillary: 128 mg/dL — ABNORMAL HIGH (ref 70–99)
Glucose-Capillary: 144 mg/dL — ABNORMAL HIGH (ref 70–99)
Glucose-Capillary: 158 mg/dL — ABNORMAL HIGH (ref 70–99)

## 2018-09-02 LAB — BASIC METABOLIC PANEL
Anion gap: 11 (ref 5–15)
CO2: 19 mmol/L — ABNORMAL LOW (ref 22–32)
CREATININE: 0.38 mg/dL — AB (ref 0.61–1.24)
Calcium: 9.3 mg/dL (ref 8.9–10.3)
Chloride: 98 mmol/L (ref 98–111)
Glucose, Bld: 137 mg/dL — ABNORMAL HIGH (ref 70–99)
Potassium: 4.4 mmol/L (ref 3.5–5.1)
SODIUM: 128 mmol/L — AB (ref 135–145)

## 2018-09-02 LAB — MAGNESIUM: MAGNESIUM: 1.4 mg/dL — AB (ref 1.7–2.4)

## 2018-09-02 MED ORDER — CHLORHEXIDINE GLUCONATE 0.12 % MT SOLN
OROMUCOSAL | Status: AC
  Filled 2018-09-02: qty 15

## 2018-09-02 MED ORDER — LORAZEPAM 2 MG/ML IJ SOLN
0.0000 mg | Freq: Two times a day (BID) | INTRAMUSCULAR | Status: DC
  Administered 2018-09-02: 2 mg via INTRAVENOUS
  Filled 2018-09-02: qty 1

## 2018-09-02 NOTE — Progress Notes (Signed)
Trauma MD on call notified regarding pts BP 183/91. MD gave verbal orders for RN to re-order ativan for CIWA protocol. Orders carried out, nursing will continue to monitor.
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
## 2018-09-02 NOTE — Progress Notes (Signed)
Subjective: The patient is alert and attentive.  He is restrained in mittens.  Objective: Vital signs in last 24 hours: Temp:  [96.6 F (35.9 C)-98.6 F (37 C)] 98.5 F (36.9 C) (09/28 0751) Pulse Rate:  [63-97] 84 (09/28 0751) Resp:  [18-26] 21 (09/28 0751) BP: (131-177)/(75-111) 169/81 (09/28 0751) SpO2:  [96 %-100 %] 100 % (09/28 0751) Weight:  [47.2 kg] 47.2 kg (09/28 0300) Estimated body mass index is 18.73 kg/m as calculated from the following:   Height as of this encounter: 5' 2.5" (1.588 m).   Weight as of this encounter: 47.2 kg.   Intake/Output from previous day: 09/27 0701 - 09/28 0700 In: 2581.4 [I.V.:1791.4; NG/GT:135; IV Piggyback:655] Out: 3100 [Urine:3100] Intake/Output this shift: No intake/output data recorded.  Physical exam the patient is alert and attentive.  He will follow simple commands, stick out his tongue, etc.  He is moving all 4 extremities.  Lab Results: No results for input(s): WBC, HGB, HCT, PLT in the last 72 hours. BMET Recent Labs    09/01/18 0229 09/02/18 0306  NA 132* 128*  K 2.8* 4.4  CL 99 98  CO2 23 19*  GLUCOSE 138* 137*  BUN <5* <5*  CREATININE 0.46* 0.38*  CALCIUM 8.6* 9.3    Studies/Results: Dg Ankle Complete Left  Result Date: 09/01/2018 CLINICAL DATA:  History of fibular fracture EXAM: LEFT ANKLE COMPLETE - 3+ VIEW COMPARISON:  None. FINDINGS: No acute fracture or dislocation is noted. No soft tissue changes are seen. IMPRESSION: No acute abnormality noted. Electronically Signed   By: Alcide Clever M.D.   On: 09/01/2018 14:25    Assessment/Plan: Subdural hematoma: The patient is stable clinically.  LOS: 7 days     Cristi Loron 09/02/2018, 7:55 AM
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
## 2018-09-03 ENCOUNTER — Inpatient Hospital Stay (HOSPITAL_COMMUNITY): Payer: Medicaid Other

## 2018-09-03 ENCOUNTER — Inpatient Hospital Stay (HOSPITAL_COMMUNITY): Payer: Medicaid Other | Admitting: Anesthesiology

## 2018-09-03 LAB — GLUCOSE, CAPILLARY
GLUCOSE-CAPILLARY: 124 mg/dL — AB (ref 70–99)
GLUCOSE-CAPILLARY: 136 mg/dL — AB (ref 70–99)
GLUCOSE-CAPILLARY: 142 mg/dL — AB (ref 70–99)
GLUCOSE-CAPILLARY: 148 mg/dL — AB (ref 70–99)
Glucose-Capillary: 125 mg/dL — ABNORMAL HIGH (ref 70–99)
Glucose-Capillary: 150 mg/dL — ABNORMAL HIGH (ref 70–99)
Glucose-Capillary: 141 mg/dL — ABNORMAL HIGH (ref 70–99)

## 2018-09-03 LAB — POCT I-STAT 3, ART BLOOD GAS (G3+)
Acid-base deficit: 1 mmol/L (ref 0.0–2.0)
Bicarbonate: 21.8 mmol/L (ref 20.0–28.0)
O2 Saturation: 97 %
TCO2: 23 mmol/L (ref 22–32)
pCO2 arterial: 31.4 mmHg — ABNORMAL LOW (ref 32.0–48.0)
pH, Arterial: 7.45 (ref 7.350–7.450)
pO2, Arterial: 81 mmHg — ABNORMAL LOW (ref 83.0–108.0)

## 2018-09-03 LAB — BLOOD GAS, ARTERIAL
ACID-BASE EXCESS: 0.4 mmol/L (ref 0.0–2.0)
BICARBONATE: 23 mmol/L (ref 20.0–28.0)
Drawn by: 50222
O2 CONTENT: 4 L/min
O2 Saturation: 88.4 %
PO2 ART: 52.6 mmHg — AB (ref 83.0–108.0)
Patient temperature: 98.6
pCO2 arterial: 27.9 mmHg — ABNORMAL LOW (ref 32.0–48.0)
pH, Arterial: 7.526 — ABNORMAL HIGH (ref 7.350–7.450)

## 2018-09-03 MED ORDER — FENTANYL CITRATE (PF) 100 MCG/2ML IJ SOLN: 50.0000 ug | Freq: Once | INTRAMUSCULAR | Status: DC

## 2018-09-03 MED ORDER — POTASSIUM CHLORIDE 20 MEQ/15ML (10%) PO SOLN
20.0000 meq | Freq: Two times a day (BID) | ORAL | Status: DC
  Administered 2018-09-03 – 2018-09-06 (×7): 20 meq
  Filled 2018-09-03 (×7): qty 15

## 2018-09-03 MED ORDER — DOCUSATE SODIUM 50 MG/5ML PO LIQD: 100.0000 mg | Freq: Two times a day (BID) | ORAL | Status: DC

## 2018-09-03 MED ORDER — IBUPROFEN 200 MG PO TABS: 800.0000 mg | ORAL_TABLET | Freq: Three times a day (TID) | ORAL | Status: DC | PRN

## 2018-09-03 MED ORDER — MIDAZOLAM HCL 2 MG/2ML IJ SOLN
2.0000 mg | INTRAMUSCULAR | Status: AC | PRN
  Administered 2018-09-03 – 2018-09-06 (×3): 2 mg via INTRAVENOUS
  Filled 2018-09-03 (×3): qty 2

## 2018-09-03 MED ORDER — ADULT MULTIVITAMIN W/MINERALS CH: 1.0000 | ORAL_TABLET | Freq: Every day | ORAL | Status: DC

## 2018-09-03 MED ORDER — CLONAZEPAM 0.125 MG PO TBDP
0.2500 mg | ORAL_TABLET | Freq: Two times a day (BID) | ORAL | Status: DC
  Administered 2018-09-03 – 2018-09-06 (×7): 0.25 mg
  Filled 2018-09-03 (×7): qty 2

## 2018-09-03 MED ORDER — QUETIAPINE FUMARATE 100 MG PO TABS
100.0000 mg | ORAL_TABLET | Freq: Two times a day (BID) | ORAL | Status: DC
  Administered 2018-09-03 – 2018-09-08 (×8): 100 mg
  Filled 2018-09-03 (×8): qty 1

## 2018-09-03 MED ORDER — QUETIAPINE FUMARATE 100 MG PO TABS: 100.0000 mg | ORAL_TABLET | Freq: Two times a day (BID) | ORAL | Status: DC

## 2018-09-03 MED ORDER — FENTANYL 2500MCG IN NS 250ML (10MCG/ML) PREMIX INFUSION
25.0000 ug/h | INTRAVENOUS | Status: DC
  Administered 2018-09-03: 50 ug/h via INTRAVENOUS
  Filled 2018-09-03: qty 250

## 2018-09-03 MED ORDER — DOCUSATE SODIUM 50 MG/5ML PO LIQD: 100.0000 mg | Freq: Two times a day (BID) | ORAL | Status: DC | PRN

## 2018-09-03 MED ORDER — ACETAMINOPHEN 325 MG PO TABS: 650.0000 mg | ORAL_TABLET | ORAL | Status: DC | PRN

## 2018-09-03 MED ORDER — CLONAZEPAM 0.125 MG PO TBDP: 0.2500 mg | ORAL_TABLET | Freq: Two times a day (BID) | ORAL | Status: DC

## 2018-09-03 MED ORDER — THIAMINE HCL 100 MG/ML IJ SOLN
100.0000 mg | Freq: Every day | INTRAMUSCULAR | Status: DC
  Administered 2018-09-03 – 2018-09-08 (×3): 100 mg via INTRAVENOUS
  Filled 2018-09-03 (×3): qty 2

## 2018-09-03 MED ORDER — ORAL CARE MOUTH RINSE
15.0000 mL | OROMUCOSAL | Status: DC
  Administered 2018-09-03 – 2018-09-06 (×32): 15 mL via OROMUCOSAL

## 2018-09-03 MED ORDER — ADULT MULTIVITAMIN W/MINERALS CH
1.0000 | ORAL_TABLET | Freq: Every day | ORAL | Status: DC
  Administered 2018-09-03 – 2018-09-08 (×5): 1
  Filled 2018-09-03 (×5): qty 1

## 2018-09-03 MED ORDER — VITAMIN B-1 100 MG PO TABS
100.0000 mg | ORAL_TABLET | Freq: Every day | ORAL | Status: DC
  Administered 2018-09-04 – 2018-09-06 (×3): 100 mg
  Filled 2018-09-03 (×3): qty 1

## 2018-09-03 MED ORDER — MIDAZOLAM HCL 2 MG/2ML IJ SOLN
2.0000 mg | INTRAMUSCULAR | Status: DC | PRN
  Filled 2018-09-03: qty 2

## 2018-09-03 MED ORDER — FENTANYL BOLUS VIA INFUSION
50.0000 ug | INTRAVENOUS | Status: DC | PRN
  Filled 2018-09-03: qty 50

## 2018-09-03 MED ORDER — POTASSIUM CHLORIDE 20 MEQ/15ML (10%) PO SOLN: 20.0000 meq | Freq: Two times a day (BID) | ORAL | Status: DC

## 2018-09-03 MED ORDER — CHLORHEXIDINE GLUCONATE 0.12% ORAL RINSE (MEDLINE KIT)
15.0000 mL | Freq: Two times a day (BID) | OROMUCOSAL | Status: DC
  Administered 2018-09-03 – 2018-09-06 (×7): 15 mL via OROMUCOSAL

## 2018-09-03 MED ORDER — FOLIC ACID 1 MG PO TABS
1.0000 mg | ORAL_TABLET | Freq: Every day | ORAL | Status: DC
  Administered 2018-09-03 – 2018-09-08 (×5): 1 mg
  Filled 2018-09-03 (×5): qty 1

## 2018-09-03 MED ORDER — SUCCINYLCHOLINE CHLORIDE 20 MG/ML IJ SOLN
INTRAMUSCULAR | Status: DC | PRN
  Administered 2018-09-03: 90 mg via INTRAVENOUS

## 2018-09-03 MED ORDER — FOLIC ACID 1 MG PO TABS: 1.0000 mg | ORAL_TABLET | Freq: Every day | ORAL | Status: DC

## 2018-09-03 MED ORDER — PROPOFOL 10 MG/ML IV BOLUS
INTRAVENOUS | Status: DC | PRN
  Administered 2018-09-03: 90 mg via INTRAVENOUS

## 2018-09-03 MED ORDER — DOCUSATE SODIUM 50 MG/5ML PO LIQD
100.0000 mg | Freq: Two times a day (BID) | ORAL | Status: DC
  Administered 2018-09-03 – 2018-09-04 (×4): 100 mg
  Filled 2018-09-03 (×5): qty 10

## 2018-09-03 MED ORDER — PIPERACILLIN-TAZOBACTAM 3.375 G IVPB
3.3750 g | Freq: Three times a day (TID) | INTRAVENOUS | Status: DC
  Administered 2018-09-03 – 2018-09-06 (×10): 3.375 g via INTRAVENOUS
  Filled 2018-09-03 (×11): qty 50

## 2018-09-03 NOTE — Progress Notes (Signed)
Dr. Tsuei paged regarding blood gas results. Reported that patient is still experiencing tachycardia and tachypnea. Verbal instructions to page anesthesia to intubate. Will place new orders for sedation and OGT. 

## 2018-09-03 NOTE — Anesthesia Procedure Notes (Signed)
Procedure Name: Intubation Date/Time: 09/03/2018 5:00 AM Performed by: Edmonia Caprio, CRNA Pre-anesthesia Checklist: Patient identified, Patient being monitored, Timeout performed, Emergency Drugs available and Suction available Patient Re-evaluated:Patient Re-evaluated prior to induction Oxygen Delivery Method: Ambu bag Preoxygenation: Pre-oxygenation with 100% oxygen Induction Type: IV induction, Cricoid Pressure applied and Rapid sequence Laryngoscope Size: McGraph and 4 Grade View: Grade I Tube type: Subglottic suction tube Tube size: 7.5 mm Number of attempts: 1 Airway Equipment and Method: Stylet and Video-laryngoscopy Placement Confirmation: ETT inserted through vocal cords under direct vision,  CO2 detector and breath sounds checked- equal and bilateral Secured at: 23 cm Tube secured with: Tape Dental Injury: Teeth and Oropharynx as per pre-operative assessment  Comments: C-Collar left in place for intubation.

## 2018-09-03 NOTE — Progress Notes (Signed)
Pts vitals continue to worse, Dr. Corliss Skains notified, orders for STAT ABG, zosyn per pharmacy and transfer orders to ICU obtained. Orders carried out. Report given to [facility] on 4N ICU pt transferred to 4N29.
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
## 2018-09-03 NOTE — Progress Notes (Signed)
Spoke with Cornett, MD on the way to CT. Notified him of neuro change as well, and minimal urine output and bladder scan of 600 ml. Order to place foley catheter.

## 2018-09-03 NOTE — Progress Notes (Signed)
Upon entering room, RN found pts cortrak hanging out of patient's nare, TF turned off & RN removed the cortrak the remainder of the way. Pt then began to vomit. HR now 130s, o2 sats 90% on RA, RR 30s/40s. MD made aware, STAT cxray ordered, 4L o2 applied, pts o2 sats now 94%.
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
## 2018-09-03 NOTE — Progress Notes (Signed)
Micheal Sea, MD to notify of change in neuro status. Patient having minimal movement of left side and an upward gaze. Orders for stat head CT obtained.

## 2018-09-03 NOTE — Progress Notes (Signed)
Contacted patient's son, Rajendra. Updated about his father's transfer to ICU for respiratory distress and intubation. The son is expected to visit this afternoon.

## 2018-09-03 NOTE — Anesthesia Postprocedure Evaluation (Signed)
Anesthesia Post Note  Patient: Micheal Medina  Procedure(s) Performed: AN AD HOC INTUBATION     Patient location during evaluation: ICU Level of consciousness: obtunded/minimal responses Respiratory status: patient on ventilator - see flowsheet for VS Cardiovascular status: stable    Last Vitals:  Vitals:   09/03/18 0515 09/03/18 0516  BP:    Pulse: (!) 149 (!) 135  Resp: (!) 35 (!) 41  Temp:    SpO2: 100% 99%    Last Pain:  Vitals:   09/03/18 0055  TempSrc: Oral  PainSc:                  Joneen Caraway

## 2018-09-03 NOTE — Progress Notes (Signed)
Patient ID: Micheal Medina, male   DOB: [birth year], 57 y.o.   MRN: [id number]  Called by nurse - Cortrak feeding tube had been pulled back.  She stopped tube feeds and tried to reposition.  Unsuccessful in repositioning, so she removed tube.  The patient began vomiting and became tachycardic with decreased mental status.  CXR showed left lower lobe atelectasis vs. Infiltrate.  Patient requiring more oxygen.  ABG obtained.  I made the decision to transfer to ICU and have the patient intubated by anesthesia.  BP stable.  Wilmon Arms. Corliss Skains, MD, [facility] Surgery  General/ Trauma Surgery Beeper [phone]  09/03/2018 4:51 AM

## 2018-09-04 ENCOUNTER — Inpatient Hospital Stay (HOSPITAL_COMMUNITY): Payer: Medicaid Other

## 2018-09-04 DIAGNOSIS — L8990 Pressure ulcer of unspecified site, unspecified stage: Secondary | ICD-10-CM

## 2018-09-04 LAB — GLUCOSE, CAPILLARY
GLUCOSE-CAPILLARY: 107 mg/dL — AB (ref 70–99)
Glucose-Capillary: 112 mg/dL — ABNORMAL HIGH (ref 70–99)
Glucose-Capillary: 117 mg/dL — ABNORMAL HIGH (ref 70–99)
Glucose-Capillary: 125 mg/dL — ABNORMAL HIGH (ref 70–99)
Glucose-Capillary: 126 mg/dL — ABNORMAL HIGH (ref 70–99)
Glucose-Capillary: 135 mg/dL — ABNORMAL HIGH (ref 70–99)

## 2018-09-04 MED ORDER — DEXMEDETOMIDINE HCL IN NACL 400 MCG/100ML IV SOLN
0.2000 ug/kg/h | INTRAVENOUS | Status: DC
  Administered 2018-09-04: 0.2 ug/kg/h via INTRAVENOUS
  Administered 2018-09-05: 0.3 ug/kg/h via INTRAVENOUS
  Administered 2018-09-06: 0.7 ug/kg/h via INTRAVENOUS
  Filled 2018-09-04 (×4): qty 100
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
## 2018-09-04 NOTE — Progress Notes (Signed)
Follow up - Trauma and Critical Care  Patient Details:    Micheal Medina is an 57 y.o. male.  Lines/tubes : Airway 7.5 mm (Active)  Secured at (cm) 23 cm 09/04/2018  7:41 AM  Measured From Lips 09/04/2018  7:41 AM  Secured Location Center 09/04/2018  7:41 AM  Secured By [employer] 09/04/2018  7:41 AM  Tube Holder Repositioned Yes 09/04/2018  7:41 AM  Cuff Pressure (cm H2O) 20 cm H2O 09/03/2018 11:40 AM  Site Condition Dry 09/04/2018  7:41 AM     NG/OG Tube 18 Fr. Center mouth Xray 65 cm (Active)  External Length of Tube (cm) - (if applicable) 66 cm 09/03/2018  8:00 PM  Site Assessment Clean;Dry;Intact 09/03/2018  8:00 PM  Ongoing Placement Verification No change in respiratory status;No change in cm markings or external length of tube from initial placement;No acute changes, not attributed to clinical condition 09/03/2018  8:00 PM  Status Suction-low intermittent 09/03/2018  8:00 PM  Amount of suction 116 mmHg 09/03/2018  8:00 PM  Drainage Appearance Tan 09/03/2018  8:00 PM  Intake (mL) 90 mL 09/03/2018 10:00 AM  Output (mL) 200 mL 09/04/2018  6:00 AM     Urethral Catheter K Boland RN Straight-tip;Latex 16 Fr. (Active)  Indication for Insertion or Continuance of Catheter Unstable critical patients (first 24-48 hours) 09/03/2018  8:00 PM  Site Assessment Clean;Intact;Dry 09/03/2018  8:00 PM  Catheter Maintenance Bag below level of bladder;Catheter secured;Drainage bag/tubing not touching floor;Insertion date on drainage bag;No dependent loops;Seal intact 09/03/2018  8:00 PM  Collection Container Standard drainage bag 09/03/2018  8:00 PM  Securement Method Securing device (Describe) 09/03/2018  8:00 PM  Urinary Catheter Interventions Unclamped 09/03/2018  8:00 PM  Output (mL) 75 mL 09/04/2018  6:00 AM    Microbiology/Sepsis markers: Results for orders placed or performed during the hospital encounter of 08/26/18  MRSA PCR Screening     Status: None   Collection Time: 08/27/18  2:00 AM   Result Value Ref Range Status   MRSA by PCR NEGATIVE NEGATIVE Final    Comment:        The GeneXpert MRSA Assay (FDA approved for NASAL specimens only), is one component of a comprehensive MRSA colonization surveillance program. It is not intended to diagnose MRSA infection nor to guide or monitor treatment for MRSA infections. Performed at [facility], [address] [place], [place] [address]     Anti-infectives:  Anti-infectives (From admission, onward)   Start     Dose/Rate Route Frequency Ordered Stop   09/03/18 0600  piperacillin-tazobactam (ZOSYN) IVPB 3.375 g     3.375 g 12.5 mL/hr over 240 Minutes Intravenous Every 8 hours 09/03/18 0406        Best Practice/Protocols:  VTE Prophylaxis: Mechanical GI Prophylaxis: Pepcid Continous Sedation  Consults: Treatment Team:  Maeola Harman, MD Teryl Lucy, MD    Events:  Subjective:    Overnight Issues: Fentanyl drip  Objective:  Vital signs for last 24 hours: Temp:  [98.5 F (36.9 C)-99.7 F (37.6 C)] 98.7 F (37.1 C) (09/30 0400) Pulse Rate:  [75-94] 76 (09/30 0741) Resp:  [12-22] 22 (09/30 0741) BP: (94-139)/(67-93) 139/76 (09/30 0741) SpO2:  [96 %-100 %] 100 % (09/30 0741) FiO2 (%):  [30 %-40 %] 30 % (09/30 0741) Weight:  [45.6 kg] 45.6 kg (09/30 0600)  Hemodynamic parameters for last 24 hours:    Intake/Output from previous day: 09/29 0701 - 09/30 0700 In: 2878.8 [I.V.:2515.4; NG/GT:90; IV Piggyback:273.4]  Out: 1880 [Urine:1280; Emesis/NG output:600]  Intake/Output this shift: No intake/output data recorded.  Vent settings for last 24 hours: Vent Mode: PRVC FiO2 (%):  [30 %-40 %] 30 % Set Rate:  [14 bmp-22 bmp] 22 bmp Vt Set:  [440 mL] 440 mL PEEP:  [5 cmH20] 5 cmH20 Plateau Pressure:  [9 cmH20-13 cmH20] 11 cmH20  Physical Exam:  General: no respiratory distress and intermittently agitated Neuro: RASS -1 and agitated HEENT/Neck: ETT WNL  Resp: clear to auscultation  bilaterally and CXR from yesterday shows no focal infiltrates and secretions are minimal CVS: regular rate and rhythm, S1, S2 normal, no murmur, click, rub or gallop GI: Soft and flat, gett no tube feedings. Extremities: no edema, no erythema, pulses WNL  Results for orders placed or performed during the hospital encounter of 08/26/18 (from the past 24 hour(s))  I-STAT 3, arterial blood gas (G3+)     Status: Abnormal   Collection Time: 09/03/18  9:26 AM  Result Value Ref Range   pH, Arterial 7.450 7.350 - 7.450   pCO2 arterial 31.4 (L) 32.0 - 48.0 mmHg   pO2, Arterial 81.0 (L) 83.0 - 108.0 mmHg   Bicarbonate 21.8 20.0 - 28.0 mmol/L   TCO2 23 22 - 32 mmol/L   O2 Saturation 97.0 %   Acid-base deficit 1.0 0.0 - 2.0 mmol/L   Patient temperature 98.5 F    Collection site RADIAL, ALLEN'S TEST ACCEPTABLE    Sample type ARTERIAL   Glucose, capillary     Status: Abnormal   Collection Time: 09/03/18 11:52 AM  Result Value Ref Range   Glucose-Capillary 150 (H) 70 - 99 mg/dL  Glucose, capillary     Status: Abnormal   Collection Time: 09/03/18  5:04 PM  Result Value Ref Range   Glucose-Capillary 141 (H) 70 - 99 mg/dL  Glucose, capillary     Status: Abnormal   Collection Time: 09/03/18  7:38 PM  Result Value Ref Range   Glucose-Capillary 142 (H) 70 - 99 mg/dL  Glucose, capillary     Status: Abnormal   Collection Time: 09/03/18 11:49 PM  Result Value Ref Range   Glucose-Capillary 136 (H) 70 - 99 mg/dL  Glucose, capillary     Status: Abnormal   Collection Time: 09/04/18  3:47 AM  Result Value Ref Range   Glucose-Capillary 125 (H) 70 - 99 mg/dL     Assessment/Plan:   NEURO  Altered Mental Status:  agitation, delirium and sedation   Plan: Wean sedation so that we may be able to extubate  PULM  ABG pending, but he did have a respiratory alkalosis   Plan: CPM  CARDIO  No specific issues   Plan: CPM  RENAL  Utrine output is good.   Plan: CPM  GI  Good bowel soounds, not getting  any tube feedings.   Plan: CPM  ID  Pneumonia (aspiration pneumonitis)   Plan: Being treated empirically for pneum onia  HEME  Anemia acute blood loss anemia and anemia of critical illness)   Plan: stable without need for blood transfusion.  ENDO No specific issues   Plan: CPM  Global Issues  Will try to move towards extubation soon.  Placed back on ICU ETOH withdrawal Precedex.  Continue empiric antibiotics for possible aspiration.    LOS: 9 days   Additional comments:I reviewed the patient's new clinical lab test results. cbc/bmet/abg and I reviewed the patients new imaging test results. cxr FROM YESTERFDAY, TODAY'S x-RAY IS PENDING.  Critical Care Total Time*:  30 Minutes  Jimmye Norman 09/04/2018  *Care during the described time interval was provided by me and/or other providers on the critical care team.  I have reviewed this patient's available data, including medical history, events of note, physical examination and test results as part of my evaluation.

## 2018-09-04 NOTE — Progress Notes (Signed)
PT Cancellation Note  Patient Details Name: Micheal Medina MRN: [id number] DOB: 1961/06/10   Cancelled Treatment:    Reason Eval/Treat Not Completed: Patient not medically ready.  Pt has declined medically and transferred to ICU.  Will try to see pt as able 10/1. 09/04/2018  [place] Bing, PT Acute Rehabilitation Services [phone]  (pager) [phone]  (office)   Eliseo Gum Kelia Gibbon 09/04/2018, 5:12 PM

## 2018-09-04 NOTE — Progress Notes (Signed)
OT Cancellation Note  Patient Details Name: Pawan Knechtel MRN: [id number] DOB: 1961-08-11   Cancelled Treatment:    Reason Eval/Treat Not Completed: Medical issues which prohibited therapy. Pt with medical decline. Transferred to ICU; intubated/sedated. Will follow up tomorrow if appropriate.   Thornell Mule, OT/L   Acute OT Clinical Specialist Acute Rehabilitation Services Pager [phone] Office [phone]  09/04/2018, 1:30 PM

## 2018-09-04 NOTE — Consult Note (Signed)
WOC Nurse wound consult note.  Pt seen in 4N29 ICU. No family present Reason for Consult: Occipital wound Wound type: No wound found on occiput. Measurement: Lower lumber area has an abrasion covered by foam dressing that measures 1.3cm x 1cm. 100% pink wound bed. No drainage no odor no induration. Continue foam dressing.  Monitor the wound area(s) for worsening of condition such as: Signs/symptoms of infection,  Increase in size,  Development of or worsening of odor, Development of pain, or increased pain at the affected locations.  Notify the medical team if any of these develop.  Thank you for the consult.  Discussed plan of care with the patient and bedside nurse.  WOC nurse will not follow at this time.  Please re-consult the WOC team if needed.  Helmut Muster, RN, MSN, CWOCN, CNS-BC, pager [phone]

## 2018-09-04 NOTE — Progress Notes (Signed)
Wasted of Fentanyl in NS with Josh, Charity fundraiser.
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
## 2018-09-05 ENCOUNTER — Encounter (HOSPITAL_COMMUNITY): Payer: Self-pay

## 2018-09-05 ENCOUNTER — Inpatient Hospital Stay: Payer: Self-pay

## 2018-09-05 LAB — CBC WITH DIFFERENTIAL/PLATELET
Abs Immature Granulocytes: 0.2 10*3/uL — ABNORMAL HIGH (ref 0.0–0.1)
Basophils Absolute: 0 10*3/uL (ref 0.0–0.1)
Basophils Relative: 0 %
EOS ABS: 0.3 10*3/uL (ref 0.0–0.7)
EOS PCT: 2 %
HEMATOCRIT: 31.1 % — AB (ref 39.0–52.0)
HEMOGLOBIN: 11.1 g/dL — AB (ref 13.0–17.0)
Immature Granulocytes: 1 %
Lymphocytes Relative: 10 %
Lymphs Abs: 1.5 10*3/uL (ref 0.7–4.0)
MCH: 34.6 pg — ABNORMAL HIGH (ref 26.0–34.0)
MCHC: 35.7 g/dL (ref 30.0–36.0)
MCV: 96.9 fL (ref 78.0–100.0)
MONO ABS: 1.7 10*3/uL — AB (ref 0.1–1.0)
Monocytes Relative: 11 %
NEUTROS PCT: 76 %
Neutro Abs: 11.3 10*3/uL — ABNORMAL HIGH (ref 1.7–7.7)
Platelets: 232 10*3/uL (ref 150–400)
RBC: 3.21 MIL/uL — ABNORMAL LOW (ref 4.22–5.81)
RDW: 10.7 % — AB (ref 11.5–15.5)
WBC: 15.1 10*3/uL — AB (ref 4.0–10.5)

## 2018-09-05 LAB — BASIC METABOLIC PANEL
Anion gap: 13 (ref 5–15)
BUN: 5 mg/dL — ABNORMAL LOW (ref 6–20)
CALCIUM: 8.9 mg/dL (ref 8.9–10.3)
CO2: 24 mmol/L (ref 22–32)
CREATININE: 0.54 mg/dL — AB (ref 0.61–1.24)
Chloride: 83 mmol/L — ABNORMAL LOW (ref 98–111)
GFR calc Af Amer: >60 mL/min (ref >60)
GFR calc non Af Amer: >60 mL/min (ref >60)
GLUCOSE: 129 mg/dL — AB (ref 70–99)
Potassium: 4.1 mmol/L (ref 3.5–5.1)
Sodium: 120 mmol/L — ABNORMAL LOW (ref 135–145)

## 2018-09-05 LAB — SODIUM
Sodium: 119 mmol/L — CL (ref 135–145)
Sodium: 123 mmol/L — ABNORMAL LOW (ref 135–145)
Sodium: 116 mmol/L — CL (ref 135–145)

## 2018-09-05 LAB — GLUCOSE, CAPILLARY
GLUCOSE-CAPILLARY: 114 mg/dL — AB (ref 70–99)
GLUCOSE-CAPILLARY: 115 mg/dL — AB (ref 70–99)
GLUCOSE-CAPILLARY: 98 mg/dL (ref 70–99)
Glucose-Capillary: 127 mg/dL — ABNORMAL HIGH (ref 70–99)
Glucose-Capillary: 107 mg/dL — ABNORMAL HIGH (ref 70–99)
Glucose-Capillary: 100 mg/dL — ABNORMAL HIGH (ref 70–99)

## 2018-09-05 LAB — OSMOLALITY: OSMOLALITY: 245 mosm/kg — AB (ref 275–295)

## 2018-09-05 LAB — SODIUM, URINE, RANDOM: SODIUM UR: 115 mmol/L

## 2018-09-05 LAB — MAGNESIUM
Magnesium: 1.4 mg/dL — ABNORMAL LOW (ref 1.7–2.4)
Magnesium: 1.5 mg/dL — ABNORMAL LOW (ref 1.7–2.4)

## 2018-09-05 LAB — OSMOLALITY, URINE: OSMOLALITY UR: 468 mosm/kg (ref 300–900)

## 2018-09-05 LAB — PHOSPHORUS
Phosphorus: 3.3 mg/dL (ref 2.5–4.6)
Phosphorus: 3.5 mg/dL (ref 2.5–4.6)

## 2018-09-05 MED ORDER — SODIUM CHLORIDE 3 % IV SOLN
INTRAVENOUS | Status: DC
  Administered 2018-09-05 – 2018-09-06 (×3): 75 mL/h via INTRAVENOUS
  Filled 2018-09-05 (×6): qty 500

## 2018-09-05 MED ORDER — SODIUM CHLORIDE 0.9 % IV SOLN
INTRAVENOUS | Status: DC
  Administered 2018-09-05: 10:00:00 via INTRAVENOUS

## 2018-09-05 MED ORDER — CHLORHEXIDINE GLUCONATE CLOTH 2 % EX PADS
6.0000 | MEDICATED_PAD | Freq: Every day | CUTANEOUS | Status: DC
  Administered 2018-09-07 – 2018-09-09 (×3): 6 via TOPICAL

## 2018-09-05 MED ORDER — VITAL HIGH PROTEIN PO LIQD: 1000.0000 mL | ORAL | Status: DC

## 2018-09-05 MED ORDER — PRO-STAT SUGAR FREE PO LIQD
30.0000 mL | Freq: Two times a day (BID) | ORAL | Status: DC
  Administered 2018-09-05: 30 mL
  Filled 2018-09-05: qty 30

## 2018-09-05 MED ORDER — SODIUM CHLORIDE 0.9% FLUSH
10.0000 mL | INTRAVENOUS | Status: DC | PRN
  Administered 2018-09-11 – 2018-09-19 (×3): 10 mL
  Filled 2018-09-05 (×3): qty 40

## 2018-09-05 MED ORDER — PIVOT 1.5 CAL PO LIQD
1000.0000 mL | ORAL | Status: DC
  Administered 2018-09-05: 1000 mL

## 2018-09-05 MED ORDER — JEVITY 1.5 CAL/FIBER PO LIQD: 1000.0000 mL | ORAL | Status: DC

## 2018-09-05 MED ORDER — ENOXAPARIN SODIUM 40 MG/0.4ML ~~LOC~~ SOLN
40.0000 mg | SUBCUTANEOUS | Status: DC
  Administered 2018-09-05 – 2018-09-21 (×17): 40 mg via SUBCUTANEOUS
  Filled 2018-09-05 (×16): qty 0.4

## 2018-09-05 NOTE — Progress Notes (Signed)
SLP Cancellation Note  Patient Details Name: Micheal Medina MRN: [id number] DOB: 1961-10-19   Cancelled treatment:       Reason Eval/Treat Not Completed: Patient not medically ready   Leandrea Ackley, Riley Nearing 09/05/2018, 7:35 AM

## 2018-09-05 NOTE — Plan of Care (Signed)
Pt started on tube feeds today.  He is currently tolerating Pivot 1.5 @40  mL.  Will continue to monitor.  Jaclyn Shaggy RN

## 2018-09-05 NOTE — Progress Notes (Signed)
PT Cancellation Note  Patient Details Name: Rodell Marrs MRN: [id number] DOB: 04/04/1961   Cancelled Treatment:    Reason Eval/Treat Not Completed: Patient not medically ready.  Pt is sedated and unable to participate with therapies. 09/05/2018  [place] Bing, PT Acute Rehabilitation Services [phone]  (pager) [phone]  (office)   Eliseo Gum [place] 09/05/2018, 4:04 PM
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
## 2018-09-05 NOTE — Progress Notes (Signed)
OT Cancellation Note  Patient Details Name: Bobbyjoe Pabst MRN: [id number] DOB: [birth year]   Cancelled Treatment:    Reason Eval/Treat Not Completed: Patient not medically ready(intubated/sedated)  Burnett Corrente Nikolaus Pienta, OT/L   Acute OT Clinical Specialist Acute Rehabilitation Services Pager [phone] Office [phone]  09/05/2018, 2:13 PM
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
## 2018-09-05 NOTE — Progress Notes (Signed)
CRITICAL VALUE ALERT  Critical Value:  Sodium 119, Serum osmolarity 145  Date & Time Notied:  09/05/18 1130  Provider Notified: Dr Janee Morn  Orders Received/Actions taken: 3% saline ordered
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
## 2018-09-05 NOTE — Progress Notes (Signed)
Peripherally Inserted Central Catheter/Midline Placement  The IV Nurse has discussed with the patient and/or persons authorized to consent for the patient, the purpose of this procedure and the potential benefits and risks involved with this procedure.  The benefits include less needle sticks, lab draws from the catheter, and the patient may be discharged home with the catheter. Risks include, but not limited to, infection, bleeding, blood clot (thrombus formation), and puncture of an artery; nerve damage and irregular heartbeat and possibility to perform a PICC exchange if needed/ordered by physician.  Alternatives to this procedure were also discussed.  Bard Power PICC patient education guide, fact sheet on infection prevention and patient information card has been provided to patient /or left at bedside.    PICC/Midline Placement Documentation  PICC Triple Lumen 09/05/18 PICC Right Brachial 30 cm 0 cm (Active)  Indication for Insertion or Continuance of Line Administration of hyperosmolar/irritating solutions (i.e. TPN, Vancomycin, etc.) 09/05/2018  3:49 PM  Exposed Catheter (cm) 0 cm 09/05/2018  3:49 PM  Site Assessment Clean;Dry;Intact 09/05/2018  3:49 PM  Lumen #1 Status Flushed;Blood return noted;Saline locked 09/05/2018  3:49 PM  Lumen #2 Status Flushed;Blood return noted;Saline locked 09/05/2018  3:49 PM  Lumen #3 Status Flushed;Blood return noted;Saline locked 09/05/2018  3:49 PM  Dressing Type Transparent 09/05/2018  3:49 PM  Dressing Status Clean;Dry;Intact 09/05/2018  3:49 PM  Dressing Change Due 09/12/18 09/05/2018  3:49 PM       Micheal Medina 09/05/2018, 3:55 PM

## 2018-09-05 NOTE — Progress Notes (Signed)
CRITICAL VALUE ALERT  Critical Value:  NA 116   Date & Time Notied:  10/1 2020   Provider Notified: Trauma MD  Orders Received/Actions taken: Revaluate with 2am Sodium draw

## 2018-09-06 ENCOUNTER — Inpatient Hospital Stay (HOSPITAL_COMMUNITY): Payer: Medicaid Other

## 2018-09-06 LAB — CBC
HEMATOCRIT: 30.6 % — AB (ref 39.0–52.0)
HEMOGLOBIN: 10.6 g/dL — AB (ref 13.0–17.0)
MCH: 34.2 pg — ABNORMAL HIGH (ref 26.0–34.0)
MCHC: 34.6 g/dL (ref 30.0–36.0)
MCV: 98.7 fL (ref 78.0–100.0)
Platelets: 309 10*3/uL (ref 150–400)
RBC: 3.1 MIL/uL — ABNORMAL LOW (ref 4.22–5.81)
RDW: 11.2 % — ABNORMAL LOW (ref 11.5–15.5)
WBC: 10.6 10*3/uL — ABNORMAL HIGH (ref 4.0–10.5)

## 2018-09-06 LAB — HEPATIC FUNCTION PANEL
ALK PHOS: 66 U/L (ref 38–126)
ALT: 30 U/L (ref 0–44)
AST: 36 U/L (ref 15–41)
Albumin: 2.9 g/dL — ABNORMAL LOW (ref 3.5–5.0)
BILIRUBIN DIRECT: 0.2 mg/dL (ref 0.0–0.2)
BILIRUBIN TOTAL: 0.6 mg/dL (ref 0.3–1.2)
Indirect Bilirubin: 0.4 mg/dL (ref 0.3–0.9)
Total Protein: 6.2 g/dL — ABNORMAL LOW (ref 6.5–8.1)

## 2018-09-06 LAB — BASIC METABOLIC PANEL
Anion gap: 5 (ref 5–15)
BUN: 10 mg/dL (ref 6–20)
CHLORIDE: 102 mmol/L (ref 98–111)
CO2: 23 mmol/L (ref 22–32)
CREATININE: 0.52 mg/dL — AB (ref 0.61–1.24)
Calcium: 8.3 mg/dL — ABNORMAL LOW (ref 8.9–10.3)
GFR calc non Af Amer: >60 mL/min (ref >60)
Glucose, Bld: 124 mg/dL — ABNORMAL HIGH (ref 70–99)
Potassium: 3.5 mmol/L (ref 3.5–5.1)
Sodium: 130 mmol/L — ABNORMAL LOW (ref 135–145)

## 2018-09-06 LAB — POCT I-STAT 3, ART BLOOD GAS (G3+)
ACID-BASE EXCESS: 2 mmol/L (ref 0.0–2.0)
Bicarbonate: 25.1 mmol/L (ref 20.0–28.0)
O2 SAT: 97 %
PCO2 ART: 33.9 mmHg (ref 32.0–48.0)
TCO2: 26 mmol/L (ref 22–32)
pH, Arterial: 7.478 — ABNORMAL HIGH (ref 7.350–7.450)
pO2, Arterial: 83 mmHg (ref 83.0–108.0)

## 2018-09-06 LAB — GLUCOSE, CAPILLARY
GLUCOSE-CAPILLARY: 108 mg/dL — AB (ref 70–99)
GLUCOSE-CAPILLARY: 96 mg/dL (ref 70–99)
Glucose-Capillary: 132 mg/dL — ABNORMAL HIGH (ref 70–99)
Glucose-Capillary: 146 mg/dL — ABNORMAL HIGH (ref 70–99)
Glucose-Capillary: 132 mg/dL — ABNORMAL HIGH (ref 70–99)
Glucose-Capillary: 90 mg/dL (ref 70–99)

## 2018-09-06 LAB — SODIUM
SODIUM: 113 mmol/L — AB (ref 135–145)
SODIUM: 132 mmol/L — AB (ref 135–145)
SODIUM: 130 mmol/L — AB (ref 135–145)
SODIUM: 133 mmol/L — AB (ref 135–145)
Sodium: 137 mmol/L (ref 135–145)

## 2018-09-06 LAB — CORTISOL-AM, BLOOD: CORTISOL - AM: 15.2 ug/dL (ref 6.7–22.6)

## 2018-09-06 LAB — PHOSPHORUS
PHOSPHORUS: 3.6 mg/dL (ref 2.5–4.6)
Phosphorus: 2.9 mg/dL (ref 2.5–4.6)

## 2018-09-06 LAB — TSH: TSH: 2.574 u[IU]/mL (ref 0.350–4.500)

## 2018-09-06 LAB — ACTH STIMULATION, 3 TIME POINTS
CORTISOL 60 MIN: 28.7 ug/dL
Cortisol, 30 Min: 28.5 ug/dL
Cortisol, Base: 15.7 ug/dL

## 2018-09-06 LAB — MAGNESIUM
MAGNESIUM: 1.8 mg/dL (ref 1.7–2.4)
Magnesium: 1.6 mg/dL — ABNORMAL LOW (ref 1.7–2.4)

## 2018-09-06 MED ORDER — FUROSEMIDE 10 MG/ML IJ SOLN
40.0000 mg | Freq: Once | INTRAMUSCULAR | Status: AC
  Administered 2018-09-06: 40 mg via INTRAVENOUS
  Filled 2018-09-06: qty 4

## 2018-09-06 MED ORDER — COSYNTROPIN 0.25 MG IJ SOLR: 0.2500 mg | Freq: Once | INTRAMUSCULAR | Status: DC

## 2018-09-06 MED ORDER — POTASSIUM CHLORIDE IN NACL 20-0.45 MEQ/L-% IV SOLN
INTRAVENOUS | Status: DC
  Administered 2018-09-06 – 2018-09-07 (×4): via INTRAVENOUS
  Filled 2018-09-06 (×3): qty 1000

## 2018-09-06 MED ORDER — ORAL CARE MOUTH RINSE
15.0000 mL | Freq: Two times a day (BID) | OROMUCOSAL | Status: DC
  Administered 2018-09-06 – 2018-09-08 (×4): 15 mL via OROMUCOSAL

## 2018-09-06 MED ORDER — SODIUM CHLORIDE 0.9 % IV SOLN
2.0000 g | Freq: Every day | INTRAVENOUS | Status: AC
  Administered 2018-09-06 – 2018-09-09 (×4): 2 g via INTRAVENOUS
  Filled 2018-09-06 (×4): qty 20

## 2018-09-06 MED ORDER — CLONAZEPAM 0.25 MG PO TBDP
0.2500 mg | ORAL_TABLET | Freq: Two times a day (BID) | ORAL | Status: DC
  Administered 2018-09-06 – 2018-09-22 (×32): 0.25 mg via ORAL
  Filled 2018-09-06 (×3): qty 1
  Filled 2018-09-06: qty 2
  Filled 2018-09-06 (×2): qty 1
  Filled 2018-09-06: qty 2
  Filled 2018-09-06 (×4): qty 1
  Filled 2018-09-06: qty 2
  Filled 2018-09-06 (×8): qty 1
  Filled 2018-09-06: qty 2
  Filled 2018-09-06 (×11): qty 1

## 2018-09-06 MED ORDER — COSYNTROPIN 0.25 MG IJ SOLR
0.2500 mg | Freq: Once | INTRAMUSCULAR | Status: AC
  Administered 2018-09-06: 0.25 mg via INTRAVENOUS
  Filled 2018-09-06: qty 0.25

## 2018-09-06 MED ORDER — CHLORHEXIDINE GLUCONATE 0.12 % MT SOLN
15.0000 mL | Freq: Two times a day (BID) | OROMUCOSAL | Status: DC
  Administered 2018-09-06 – 2018-09-08 (×4): 15 mL via OROMUCOSAL
  Filled 2018-09-06 (×4): qty 15

## 2018-09-06 MED ORDER — SODIUM CHLORIDE 3 % IV SOLN
INTRAVENOUS | Status: DC
  Filled 2018-09-06: qty 500

## 2018-09-06 NOTE — Progress Notes (Signed)
Follow up - Trauma and Critical Care  Patient Details:    Micheal Medina is an 57 y.o. male.  Lines/tubes : Airway 7.5 mm (Active)  Secured at (cm) 23 cm 09/06/2018  7:40 AM  Measured From Lips 09/06/2018  8:00 AM  Secured Location Center 09/06/2018  7:40 AM  Secured By [employer] 09/06/2018  7:40 AM  Tube Holder Repositioned Yes 09/06/2018  7:40 AM  Cuff Pressure (cm H2O) 20 cm H2O 09/06/2018  7:40 AM  Site Condition Dry 09/06/2018  8:00 AM     PICC Triple Lumen 09/05/18 PICC Right Brachial 30 cm 0 cm (Active)  Indication for Insertion or Continuance of Line Administration of hyperosmolar/irritating solutions (i.e. TPN, Vancomycin, etc.) 09/06/2018  8:00 AM  Exposed Catheter (cm) 0 cm 09/05/2018  3:49 PM  Site Assessment Clean;Dry;Intact 09/06/2018  8:00 AM  Lumen #1 Status Infusing 09/06/2018  8:00 AM  Lumen #2 Status Infusing 09/06/2018  8:00 AM  Lumen #3 Status Infusing;In-line blood sampling system in place 09/06/2018  8:00 AM  Dressing Type Transparent;Occlusive 09/06/2018  8:00 AM  Dressing Status Clean;Dry;Intact;Antimicrobial disc in place 09/06/2018  8:00 AM  Line Care Connections checked and tightened 09/06/2018  8:00 AM  Dressing Change Due 09/12/18 09/06/2018  8:00 AM     NG/OG Tube 18 Fr. Center mouth Xray 65 cm (Active)  External Length of Tube (cm) - (if applicable) 66 cm 09/05/2018  8:00 PM  Site Assessment Clean;Dry;Intact 09/06/2018  8:00 AM  Ongoing Placement Verification No acute changes, not attributed to clinical condition;No change in respiratory status;No change in cm markings or external length of tube from initial placement 09/06/2018  8:00 AM  Status Suction-low intermittent 09/06/2018  8:00 AM  Amount of suction 116 mmHg 09/06/2018  8:00 AM  Drainage Appearance Green 09/05/2018  8:00 AM  Intake (mL) 90 mL 09/03/2018 10:00 AM  Output (mL) 160 mL 09/04/2018  7:45 PM     External Urinary Catheter (Active)  Collection Container Standard drainage bag 09/06/2018  8:00  AM  Output (mL) 400 mL 09/06/2018  6:00 AM    Microbiology/Sepsis markers: Results for orders placed or performed during the hospital encounter of 08/26/18  MRSA PCR Screening     Status: None   Collection Time: 08/27/18  2:00 AM  Result Value Ref Range Status   MRSA by PCR NEGATIVE NEGATIVE Final    Comment:        The GeneXpert MRSA Assay (FDA approved for NASAL specimens only), is one component of a comprehensive MRSA colonization surveillance program. It is not intended to diagnose MRSA infection nor to guide or monitor treatment for MRSA infections. Performed at [facility], [address], [place], [place] [address]   Culture, respiratory (non-expectorated)     Status: None (Preliminary result)   Collection Time: 09/04/18  3:25 PM  Result Value Ref Range Status   Specimen Description TRACHEAL ASPIRATE  Final   Special Requests Normal  Final   Gram Stain   Final    RARE WBC PRESENT, PREDOMINANTLY PMN NO ORGANISMS SEEN    Culture   Final    FEW KLEBSIELLA PNEUMONIAE CULTURE REINCUBATED FOR BETTER GROWTH Performed at [facility], [address] [place], [place] [address]    Report Status PENDING  Incomplete   Organism ID, Bacteria KLEBSIELLA PNEUMONIAE  Final      Susceptibility   Klebsiella pneumoniae - MIC*    AMPICILLIN >=32 RESISTANT Resistant     CEFAZOLIN <=4  SENSITIVE Sensitive     CEFEPIME <=1 SENSITIVE Sensitive     CEFTAZIDIME <=1 SENSITIVE Sensitive     CEFTRIAXONE <=1 SENSITIVE Sensitive     CIPROFLOXACIN <=0.25 SENSITIVE Sensitive     GENTAMICIN <=1 SENSITIVE Sensitive     IMIPENEM <=0.25 SENSITIVE Sensitive     TRIMETH/SULFA <=20 SENSITIVE Sensitive     AMPICILLIN/SULBACTAM 8 SENSITIVE Sensitive     PIP/TAZO <=4 SENSITIVE Sensitive     Extended ESBL NEGATIVE Sensitive     * FEW KLEBSIELLA PNEUMONIAE    Anti-infectives:  Anti-infectives (From admission, onward)   Start     Dose/Rate Route Frequency Ordered Stop   09/03/18 0600   piperacillin-tazobactam (ZOSYN) IVPB 3.375 g     3.375 g 12.5 mL/hr over 240 Minutes Intravenous Every 8 hours 09/03/18 0406        Best Practice/Protocols:  VTE Prophylaxis: Lovenox (prophylaxtic dose) and Mechanical GI Prophylaxis: Pepcid Continous Sedation Precedex  Consults: Treatment Team:  Maeola Harman, MD Teryl Lucy, MD    Events:  Subjective:    Overnight Issues: Hyponatremia has been the biggest issues, but seems to be coming up  Objective:  Vital signs for last 24 hours: Temp:  [97.2 F (36.2 C)-99.8 F (37.7 C)] 99.4 F (37.4 C) (10/02 0800) Pulse Rate:  [77-102] 90 (10/02 0800) Resp:  [14-44] 22 (10/02 0800) BP: (106-150)/(67-108) 125/77 (10/02 0800) SpO2:  [100 %] 100 % (10/02 0800) FiO2 (%):  [30 %] 30 % (10/02 0740) Weight:  [45.2 kg] 45.2 kg (10/02 0500)  Hemodynamic parameters for last 24 hours:    Intake/Output from previous day: 10/01 0701 - 10/02 0700 In: 3643.5 [I.V.:2778.6; NG/GT:618; IV Piggyback:246.9] Out: 5360 [Urine:5360]  Intake/Output this shift: Total I/O In: 145.5 [I.V.:132.9; IV Piggyback:12.5] Out: -   Vent settings for last 24 hours: Vent Mode: PRVC FiO2 (%):  [30 %] 30 % Set Rate:  [14 bmp] 14 bmp Vt Set:  [440 mL] 440 mL PEEP:  [5 cmH20] 5 cmH20 Pressure Support:  [10 cmH20] 10 cmH20 Plateau Pressure:  [10 cmH20-13 cmH20] 13 cmH20  Physical Exam:  General: no respiratory distress and responsive Neuro: alert, oriented, nonfocal exam and RASS 0 HEENT/Neck: no JVD, ETT WNL  and PERRL Resp: clear to auscultation bilaterally CVS: regular rate and rhythm, S1, S2 normal, no murmur, click, rub or gallop GI: Soft tolerating tube feedings well. Extremities: no edema, no erythema, pulses WNL  Results for orders placed or performed during the hospital encounter of 08/26/18 (from the past 24 hour(s))  Glucose, capillary     Status: Abnormal   Collection Time: 09/05/18 11:41 AM  Result Value Ref Range    Glucose-Capillary 107 (H) 70 - 99 mg/dL   Comment 1 Notify RN    Comment 2 Document in Chart   Sodium     Status: Abnormal   Collection Time: 09/05/18  2:55 PM  Result Value Ref Range   Sodium 123 (L) 135 - 145 mmol/L  Magnesium     Status: Abnormal   Collection Time: 09/05/18  3:01 PM  Result Value Ref Range   Magnesium 1.5 (L) 1.7 - 2.4 mg/dL  Phosphorus     Status: None   Collection Time: 09/05/18  3:01 PM  Result Value Ref Range   Phosphorus 3.5 2.5 - 4.6 mg/dL  Glucose, capillary     Status: Abnormal   Collection Time: 09/05/18  4:45 PM  Result Value Ref Range   Glucose-Capillary 100 (H) 70 - 99 mg/dL  Glucose, capillary     Status: None   Collection Time: 09/05/18  7:53 PM  Result Value Ref Range   Glucose-Capillary 98 70 - 99 mg/dL  Sodium     Status: Abnormal   Collection Time: 09/05/18  9:42 PM  Result Value Ref Range   Sodium 116 (LL) 135 - 145 mmol/L  Glucose, capillary     Status: Abnormal   Collection Time: 09/05/18 11:30 PM  Result Value Ref Range   Glucose-Capillary 114 (H) 70 - 99 mg/dL  Sodium     Status: Abnormal   Collection Time: 09/06/18  2:00 AM  Result Value Ref Range   Sodium 113 (LL) 135 - 145 mmol/L  Glucose, capillary     Status: Abnormal   Collection Time: 09/06/18  3:50 AM  Result Value Ref Range   Glucose-Capillary 132 (H) 70 - 99 mg/dL  CBC     Status: Abnormal   Collection Time: 09/06/18  3:56 AM  Result Value Ref Range   WBC 10.6 (H) 4.0 - 10.5 K/uL   RBC 3.10 (L) 4.22 - 5.81 MIL/uL   Hemoglobin 10.6 (L) 13.0 - 17.0 g/dL   HCT 40.9 (L) 81.1 - 91.4 %   MCV 98.7 78.0 - 100.0 fL   MCH 34.2 (H) 26.0 - 34.0 pg   MCHC 34.6 30.0 - 36.0 g/dL   RDW 78.2 (L) 95.6 - 21.3 %   Platelets 309 150 - 400 K/uL  Basic metabolic panel     Status: Abnormal   Collection Time: 09/06/18  3:56 AM  Result Value Ref Range   Sodium 130 (L) 135 - 145 mmol/L   Potassium 3.5 3.5 - 5.1 mmol/L   Chloride 102 98 - 111 mmol/L   CO2 23 22 - 32 mmol/L   Glucose,  Bld 124 (H) 70 - 99 mg/dL   BUN 10 6 - 20 mg/dL   Creatinine, Ser 0.86 (L) 0.61 - 1.24 mg/dL   Calcium 8.3 (L) 8.9 - 10.3 mg/dL   GFR calc non Af Amer >60 >60 mL/min   GFR calc Af Amer >60 >60 mL/min   Anion gap 5 5 - 15  Magnesium     Status: Abnormal   Collection Time: 09/06/18  3:56 AM  Result Value Ref Range   Magnesium 1.6 (L) 1.7 - 2.4 mg/dL  Phosphorus     Status: None   Collection Time: 09/06/18  3:56 AM  Result Value Ref Range   Phosphorus 3.6 2.5 - 4.6 mg/dL  TSH     Status: None   Collection Time: 09/06/18  3:56 AM  Result Value Ref Range   TSH 2.574 0.350 - 4.500 uIU/mL  Cortisol-am, blood     Status: None   Collection Time: 09/06/18  3:56 AM  Result Value Ref Range   Cortisol - AM 15.2 6.7 - 22.6 ug/dL  ACTH stimulation, 3 time points     Status: None   Collection Time: 09/06/18  3:56 AM  Result Value Ref Range   Cortisol, Base 15.7 ug/dL   Cortisol, 30 Min 57.8 ug/dL   Cortisol, 60 Min 46.9 ug/dL  Hepatic function panel     Status: Abnormal   Collection Time: 09/06/18  3:56 AM  Result Value Ref Range   Total Protein 6.2 (L) 6.5 - 8.1 g/dL   Albumin 2.9 (L) 3.5 - 5.0 g/dL   AST 36 15 - 41 U/L   ALT 30 0 - 44 U/L   Alkaline Phosphatase 66 38 -  126 U/L   Total Bilirubin 0.6 0.3 - 1.2 mg/dL   Bilirubin, Direct 0.2 0.0 - 0.2 mg/dL   Indirect Bilirubin 0.4 0.3 - 0.9 mg/dL  Glucose, capillary     Status: Abnormal   Collection Time: 09/06/18  8:25 AM  Result Value Ref Range   Glucose-Capillary 146 (H) 70 - 99 mg/dL   Comment 1 Notify RN    Comment 2 Document in Chart      Assessment/Plan:   NEURO  Altered Mental Status:  sedation   Plan: Improving with weaning and on precedex  PULM  Atelectasis/collapse (RUL and LLL)   Plan: Will continue to follow after extubatioin  CARDIO  No issues   Plan: CPm  RENAL  Hypokalemia moderate (2.8 - 3.5 meq/dl) and Hyponatremia moderate (125 - 135 meq/dl)   Plan: Improving on hypertonic saline.  Will probably stopp  3% saline today.  GI  No specific issues   Plan: Hold tube feedings for extubatiion.  ID  Pneumonia (aspiration pneumonitis and hospital acquired (not ventilator-associated) Klebsiella pneumonia)   Plan: Cahnge to Rocephin from Zosyn  HEME  Anemia acute blood loss anemia)   Plan: Stable and does not require transfusion  ENDO Hypokalemia (moderate (2.8 - 3.5 meq/dl)) and Hyponatremia (moderate (125 - 135 meq/dl))   Plan: Both improving.  Global Issues  Hyponatremia improving.  Will stop 3% saline later today.  Wean and extubate.  Will need therapies and swallowing  Hold tube feedings.  Wean sedation'    LOS: 11 days   Additional comments:I reviewed the patient's new clinical lab test results. cbc/bmet/abg and I reviewed the patients new imaging test results. CXR  Critical Care Total Time*: 30 Minutes  Jimmye Norman 09/06/2018  *Care during the described time interval was provided by me and/or other providers on the critical care team.  I have reviewed this patient's available data, including medical history, events of note, physical examination and test results as part of my evaluation.
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
## 2018-09-06 NOTE — Progress Notes (Signed)
Notified Dr Lindie Spruce that patients Na+ was 132. 3% gtt stopped per Dr Lindie Spruce. Will continue to monitor Na+ levels every 4 hours.

## 2018-09-06 NOTE — Progress Notes (Signed)
Pt with worsening hyponatremia despite hypertonic saline infusion. Serum osm 280m urine osm 468, urine sodium 115. Likely SIADH but will check TSH and cosyntropin stim test, ddx also includes med rxn though less likely ie seroquel. Continue hypertonic saline infusion, will try a dose of lasix. Consider bolus of hypertonic saline if no improvement on next check

## 2018-09-06 NOTE — Progress Notes (Signed)
CRITICAL VALUE ALERT  Critical Value:  NA 113  Date & Time Notied:  0300 09/06/18  Provider Notified: On call Trauma MD  Orders Received/Actions taken: Will evaluate and give orders accordingly.

## 2018-09-06 NOTE — Procedures (Signed)
Extubation Procedure Note  Patient Details:   Name: Micheal Medina DOB: [birth year] MRN: [id number]   Airway Documentation:    Vent end date: 09/06/18 Vent end time: 1247   Evaluation  O2 sats: stable throughout Complications: No apparent complications Patient did tolerate procedure well. Bilateral Breath Sounds: Rhonchi, Diminished   Yes   Pt extubated to 2L [place] per MD order. Positive cuff leak noted prior to extubated. VS within normal limits, no increased WOB, no distress noted. Pt able to speak and has a very weak, non productive cough post extubation. Pt encouraged to use Yankuer to clear secretions. NO stridor noted. RT will continue to monitor.   Carolan Shiver 09/06/2018, 12:48 PM

## 2018-09-06 NOTE — Progress Notes (Addendum)
Subjective: Patient reports (intubated)  Objective: Vital signs in last 24 hours: Temp:  [97.2 F (36.2 C)-99.8 F (37.7 C)] 98.5 F (36.9 C) (10/02 0400) Pulse Rate:  [71-102] 90 (10/02 0800) Resp:  [14-44] 22 (10/02 0800) BP: (106-157)/(67-108) 125/77 (10/02 0800) SpO2:  [100 %] 100 % (10/02 0800) FiO2 (%):  [30 %] 30 % (10/02 0400) Weight:  [45.2 kg] 45.2 kg (10/02 0500)  Intake/Output from previous day: 10/01 0701 - 10/02 0700 In: 3643.5 [I.V.:2778.6; NG/GT:618; IV Piggyback:246.9] Out: 5360 [Urine:5360] Intake/Output this shift: Total I/O In: 145.5 [I.V.:132.9; IV Piggyback:12.5] Out: -   Opens eys to voice. Moves extremities, following some commands. Vent support continues. NA 130at [id number]  Lab Results: Recent Labs    09/05/18 0436 09/06/18 0356  WBC 15.1* 10.6*  HGB 11.1* 10.6*  HCT 31.1* 30.6*  PLT 232 309   BMET Recent Labs    09/05/18 0436  09/06/18 0200 09/06/18 0356  NA 120*   < > 113* 130*  K 4.1  --   --  3.5  CL 83*  --   --  102  CO2 24  --   --  23  GLUCOSE 129*  --   --  124*  BUN 5*  --   --  10  CREATININE 0.54*  --   --  0.52*  CALCIUM 8.9  --   --  8.3*   < > = values in this interval not displayed.    Studies/Results: Dg Chest Port 1 View  Result Date: 09/04/2018 CLINICAL DATA:  Aspiration pneumonia EXAM: PORTABLE CHEST 1 VIEW COMPARISON:  09/03/2018 FINDINGS: Consolidation in the left lower lobe is stable since prior study. Endotracheal tube and NG tube are unchanged. Right lung clear. Heart is normal size. IMPRESSION: Consolidation left lower lobe is unchanged. Electronically Signed   By: Charlett Nose M.D.   On: 09/04/2018 09:26   [place] Ekg Site Rite  Result Date: 09/05/2018 If Site Rite image not attached, placement could not be confirmed due to current cardiac rhythm.   Assessment/Plan:   LOS: 11 days  Supportive care continues   [name] 09/06/2018, 8:15 AM   Na much improved.  Continue supportive care.
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
## 2018-09-07 LAB — GLUCOSE, CAPILLARY
GLUCOSE-CAPILLARY: 101 mg/dL — AB (ref 70–99)
GLUCOSE-CAPILLARY: 93 mg/dL (ref 70–99)
Glucose-Capillary: 85 mg/dL (ref 70–99)
Glucose-Capillary: 98 mg/dL (ref 70–99)
Glucose-Capillary: 84 mg/dL (ref 70–99)

## 2018-09-07 LAB — CULTURE, RESPIRATORY W GRAM STAIN: Special Requests: NORMAL

## 2018-09-07 LAB — BASIC METABOLIC PANEL
ANION GAP: 9 (ref 5–15)
BUN: 9 mg/dL (ref 6–20)
CALCIUM: 8.9 mg/dL (ref 8.9–10.3)
CO2: 23 mmol/L (ref 22–32)
CREATININE: 0.45 mg/dL — AB (ref 0.61–1.24)
Chloride: 99 mmol/L (ref 98–111)
GFR calc Af Amer: >60 mL/min (ref >60)
GLUCOSE: 101 mg/dL — AB (ref 70–99)
Potassium: 3.5 mmol/L (ref 3.5–5.1)
Sodium: 131 mmol/L — ABNORMAL LOW (ref 135–145)

## 2018-09-07 LAB — CULTURE, RESPIRATORY

## 2018-09-07 LAB — SODIUM: Sodium: 132 mmol/L — ABNORMAL LOW (ref 135–145)

## 2018-09-07 MED ORDER — METOPROLOL TARTRATE 5 MG/5ML IV SOLN
5.0000 mg | Freq: Four times a day (QID) | INTRAVENOUS | Status: DC
  Administered 2018-09-07 – 2018-09-17 (×32): 5 mg via INTRAVENOUS
  Filled 2018-09-07 (×37): qty 5

## 2018-09-07 MED ORDER — HYDRALAZINE HCL 20 MG/ML IJ SOLN
10.0000 mg | Freq: Four times a day (QID) | INTRAMUSCULAR | Status: DC | PRN
  Administered 2018-09-07 – 2018-09-08 (×2): 10 mg via INTRAVENOUS
  Filled 2018-09-07 (×3): qty 1
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
## 2018-09-07 NOTE — Progress Notes (Signed)
Patient is no longer on restraints. Patient do not have any insurance but referrals where faxed out incase LOG is approved once medically ready for discharge. CSW continues to follow.   Antony Blackbird, [facility] Clinical Social Worker [phone]

## 2018-09-07 NOTE — Progress Notes (Signed)
Overall stable.  No new issues or problems.  Continue supportive care.  No new recommendations.

## 2018-09-08 ENCOUNTER — Inpatient Hospital Stay (HOSPITAL_COMMUNITY): Payer: Medicaid Other

## 2018-09-08 LAB — BASIC METABOLIC PANEL
Anion gap: 13 (ref 5–15)
BUN: 7 mg/dL (ref 6–20)
CO2: 21 mmol/L — ABNORMAL LOW (ref 22–32)
Calcium: 8.8 mg/dL — ABNORMAL LOW (ref 8.9–10.3)
Chloride: 92 mmol/L — ABNORMAL LOW (ref 98–111)
Creatinine, Ser: 0.52 mg/dL — ABNORMAL LOW (ref 0.61–1.24)
GFR calc Af Amer: >60 mL/min (ref >60)
GFR calc non Af Amer: >60 mL/min (ref >60)
Glucose, Bld: 94 mg/dL (ref 70–99)
Potassium: 3.4 mmol/L — ABNORMAL LOW (ref 3.5–5.1)
SODIUM: 126 mmol/L — AB (ref 135–145)

## 2018-09-08 LAB — CBC
HEMATOCRIT: 31.2 % — AB (ref 39.0–52.0)
Hemoglobin: 10.9 g/dL — ABNORMAL LOW (ref 13.0–17.0)
MCH: 33.7 pg (ref 26.0–34.0)
MCHC: 34.9 g/dL (ref 30.0–36.0)
MCV: 96.6 fL (ref 78.0–100.0)
Platelets: 382 10*3/uL (ref 150–400)
RBC: 3.23 MIL/uL — ABNORMAL LOW (ref 4.22–5.81)
RDW: 11 % — AB (ref 11.5–15.5)
WBC: 11.3 10*3/uL — AB (ref 4.0–10.5)

## 2018-09-08 LAB — GLUCOSE, CAPILLARY
GLUCOSE-CAPILLARY: 106 mg/dL — AB (ref 70–99)
GLUCOSE-CAPILLARY: 81 mg/dL (ref 70–99)
GLUCOSE-CAPILLARY: 84 mg/dL (ref 70–99)
Glucose-Capillary: 80 mg/dL (ref 70–99)
Glucose-Capillary: 88 mg/dL (ref 70–99)

## 2018-09-08 MED ORDER — QUETIAPINE FUMARATE 100 MG PO TABS
100.0000 mg | ORAL_TABLET | Freq: Two times a day (BID) | ORAL | Status: DC
  Administered 2018-09-08 – 2018-09-22 (×28): 100 mg via ORAL
  Filled 2018-09-08 (×28): qty 1

## 2018-09-08 MED ORDER — ADULT MULTIVITAMIN W/MINERALS CH
1.0000 | ORAL_TABLET | Freq: Every day | ORAL | Status: DC
  Administered 2018-09-09 – 2018-09-22 (×14): 1 via ORAL
  Filled 2018-09-08 (×14): qty 1

## 2018-09-08 MED ORDER — DOCUSATE SODIUM 50 MG/5ML PO LIQD: 100.0000 mg | Freq: Two times a day (BID) | ORAL | Status: DC | PRN

## 2018-09-08 MED ORDER — RESOURCE THICKENUP CLEAR PO POWD
ORAL | Status: DC | PRN
  Filled 2018-09-08: qty 125

## 2018-09-08 MED ORDER — FAMOTIDINE 20 MG PO TABS
20.0000 mg | ORAL_TABLET | Freq: Two times a day (BID) | ORAL | Status: DC
  Administered 2018-09-08 – 2018-09-22 (×27): 20 mg via ORAL
  Filled 2018-09-08 (×27): qty 1

## 2018-09-08 MED ORDER — IBUPROFEN 200 MG PO TABS
800.0000 mg | ORAL_TABLET | Freq: Three times a day (TID) | ORAL | Status: DC | PRN
  Administered 2018-09-13 – 2018-09-19 (×2): 800 mg via ORAL
  Filled 2018-09-08 (×3): qty 4

## 2018-09-08 MED ORDER — POTASSIUM CHLORIDE IN NACL 20-0.9 MEQ/L-% IV SOLN
INTRAVENOUS | Status: DC
  Administered 2018-09-08 – 2018-09-09 (×3): via INTRAVENOUS
  Filled 2018-09-08 (×4): qty 1000

## 2018-09-08 MED ORDER — VITAMIN B-1 100 MG PO TABS
100.0000 mg | ORAL_TABLET | Freq: Every day | ORAL | Status: DC
  Administered 2018-09-09 – 2018-09-22 (×14): 100 mg via ORAL
  Filled 2018-09-08 (×14): qty 1

## 2018-09-08 MED ORDER — POTASSIUM CHLORIDE 20 MEQ PO PACK
20.0000 meq | PACK | Freq: Two times a day (BID) | ORAL | Status: DC
  Administered 2018-09-08 – 2018-09-22 (×27): 20 meq via ORAL
  Filled 2018-09-08 (×30): qty 1

## 2018-09-08 MED ORDER — FOLIC ACID 1 MG PO TABS
1.0000 mg | ORAL_TABLET | Freq: Every day | ORAL | Status: DC
  Administered 2018-09-09 – 2018-09-22 (×14): 1 mg via ORAL
  Filled 2018-09-08 (×14): qty 1

## 2018-09-08 MED ORDER — ACETAMINOPHEN 325 MG PO TABS: 650.0000 mg | ORAL_TABLET | ORAL | Status: DC | PRN

## 2018-09-08 MED ORDER — DOCUSATE SODIUM 100 MG PO CAPS
100.0000 mg | ORAL_CAPSULE | Freq: Two times a day (BID) | ORAL | Status: DC
  Administered 2018-09-08 – 2018-09-21 (×26): 100 mg via ORAL
  Filled 2018-09-08 (×26): qty 1

## 2018-09-08 NOTE — Progress Notes (Signed)
Called phlebotomy to remind them that patient had been made a lab draw.
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
## 2018-09-08 NOTE — Evaluation (Signed)
Modified Barium Swallow Progress Note  Patient Details  Name: Micheal Medina MRN: [id number] Date of Birth: Apr 20, 1961  Today's Date: 09/08/2018  Modified Barium Swallow completed.  Full report located under Chart Review in the Imaging Section.  Brief recommendations include the following:  Clinical Impression  Pt presents with mild/moderate sensorimotor oropharyngeal dysphagia characterized by decreased oral coordination with premature spillage and delayed swallow trigger resulting in aspiration of thin liquids and sensation of aspiration was volume dependent. Dysphagia is negatively impacted by cognition (Rancho Level IV/V) and inconsistent positioning with patient often leaning and/or actively moving to various positions, furthermore note cervical collar causing upward chin position and Pt's inability to self feed. Larger volume of aspiration with cup sip of thin liquid was sensed and expelled by overt and immediate coughing however note trace silent aspiration of thin liquids with consecutive straw sips. Flash penetration of nectar thick liquids with a small sip however with larger presentations no penetration or aspiration of NTL was observed. Delayed swallowing trigger to the level of the valleculae with puree and mechanical soft textures. Note good hyolaryngeal excursion and velopharyngeal vestibule closure however decreased coordination/awareness, delayed swallow trigger and decreased sensation resulting in moderate aspiration risk. Recommend initiate Dys 1/puree diet and Nectar thick liquids with precautions re: upright positioning for all PO with inconsistent need for head/neck support, encourage self feeding with hand over hand to improve awareness of bolus, straws OK with NTL.    Swallow Evaluation Recommendations       SLP Diet Recommendations: Dysphagia 1 (Puree) solids;Nectar thick liquid   Liquid Administration via: Cup;Straw   Medication Administration: Whole meds with puree   Supervision: Full assist for feeding;Staff to assist with self feeding;Full supervision/cueing for compensatory strategies   Compensations: Minimize environmental distractions;Slow rate;Small sips/bites;Follow solids with liquid   Postural Changes: Remain semi-upright after after feeds/meals (Comment);Seated upright at 90 degrees   Oral Care Recommendations: Oral care BID   Other Recommendations: Order thickener from pharmacy  Amelia H. Romie Levee, CCC-SLP Speech Language Pathologist   Georgetta Haber 09/08/2018,2:43 PM
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
## 2018-09-08 NOTE — Progress Notes (Signed)
Trauma Service Note  Subjective: The most awake and responsive that I have seen him.  Not acute distress, b ut complains of pain in his arms and legs with movement.  Objective: Vital signs in last 24 hours: Temp:  [98.2 F (36.8 C)-99.1 F (37.3 C)] 98.2 F (36.8 C) (10/04 0800) Pulse Rate:  [77-104] 77 (10/04 0800) Resp:  [14-28] 24 (10/04 0800) BP: (142-169)/(70-100) 157/87 (10/04 0800) SpO2:  [97 %-100 %] 100 % (10/04 0800) Weight:  [44.8 kg] 44.8 kg (10/04 0430) Last BM Date: 09/05/18  Intake/Output from previous day: 10/03 0701 - 10/04 0700 In: 1921.5 [I.V.:1721.4; IV Piggyback:200.1] Out: 1120 [Urine:1120] Intake/Output this shift: No intake/output data recorded.  General: No acute distress  Lungs: Clear, but coughs spontaneously with productive sputum  Abd: Benign  Extremities: No evidence of deformities. Cannot tell me if he has neck pain or tenderness of pain.  Has not neurological deficits   Neuro: Confused, but awakening more  Lab Results: CBC  Recent Labs    09/06/18 0356 09/08/18 0728  WBC 10.6* 11.3*  HGB 10.6* 10.9*  HCT 30.6* 31.2*  PLT 309 382   BMET Recent Labs    09/07/18 1416 09/08/18 0728  NA 131* 126*  K 3.5 3.4*  CL 99 92*  CO2 23 21*  GLUCOSE 101* 94  BUN 9 7  CREATININE 0.45* 0.52*  CALCIUM 8.9 8.8*   PT/INR No results for input(s): LABPROT, INR in the last 72 hours. ABG Recent Labs    09/06/18 1055  PHART 7.478*  HCO3 25.1    Studies/Results: No results found.  Anti-infectives: Anti-infectives (From admission, onward)   Start     Dose/Rate Route Frequency Ordered Stop   09/06/18 1200  cefTRIAXone (ROCEPHIN) 2 g in sodium chloride 0.9 % 100 mL IVPB     2 g 200 mL/hr over 30 Minutes Intravenous Daily 09/06/18 1049 09/09/18 2359   09/03/18 0600  piperacillin-tazobactam (ZOSYN) IVPB 3.375 g  Status:  Discontinued     3.375 g 12.5 mL/hr over 240 Minutes Intravenous Every 8 hours 09/03/18 0406 09/06/18 1049       Assessment/Plan: s/p  Modified bariusm swallow later today to see if we can start a diet.  Hyponatremis starting dreep in again, but patient has been on 1/2NS solution again.  LOS: 13 days   Marta Lamas. Gae Bon, MD, FACS [phone] Trauma Surgeon 09/08/2018
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
## 2018-09-09 LAB — BASIC METABOLIC PANEL
Anion gap: 12 (ref 5–15)
BUN: 7 mg/dL (ref 6–20)
CHLORIDE: 93 mmol/L — AB (ref 98–111)
CO2: 21 mmol/L — AB (ref 22–32)
Calcium: 8.5 mg/dL — ABNORMAL LOW (ref 8.9–10.3)
Creatinine, Ser: 0.4 mg/dL — ABNORMAL LOW (ref 0.61–1.24)
GFR calc non Af Amer: >60 mL/min (ref >60)
Glucose, Bld: 85 mg/dL (ref 70–99)
POTASSIUM: 3.6 mmol/L (ref 3.5–5.1)
SODIUM: 126 mmol/L — AB (ref 135–145)

## 2018-09-09 LAB — GLUCOSE, CAPILLARY
GLUCOSE-CAPILLARY: 82 mg/dL (ref 70–99)
GLUCOSE-CAPILLARY: 110 mg/dL — AB (ref 70–99)
GLUCOSE-CAPILLARY: 130 mg/dL — AB (ref 70–99)
GLUCOSE-CAPILLARY: 154 mg/dL — AB (ref 70–99)
GLUCOSE-CAPILLARY: 94 mg/dL (ref 70–99)
Glucose-Capillary: 121 mg/dL — ABNORMAL HIGH (ref 70–99)
Glucose-Capillary: 90 mg/dL (ref 70–99)

## 2018-09-09 MED ORDER — LORAZEPAM 2 MG/ML IJ SOLN
0.5000 mg | Freq: Once | INTRAMUSCULAR | Status: AC
  Administered 2018-09-09: 0.5 mg via INTRAMUSCULAR
  Filled 2018-09-09: qty 1
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
## 2018-09-09 NOTE — Plan of Care (Signed)
Patient has a decreased appetite, refused to eat dinner encouraged applesauce as a snack in which patient consumed 3/4 of container.  Vitals remain stable, will continue to monitor closely.
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
## 2018-09-10 ENCOUNTER — Inpatient Hospital Stay (HOSPITAL_COMMUNITY): Payer: Medicaid Other

## 2018-09-10 LAB — GLUCOSE, CAPILLARY
GLUCOSE-CAPILLARY: 110 mg/dL — AB (ref 70–99)
GLUCOSE-CAPILLARY: 119 mg/dL — AB (ref 70–99)
GLUCOSE-CAPILLARY: 107 mg/dL — AB (ref 70–99)
GLUCOSE-CAPILLARY: 86 mg/dL (ref 70–99)
Glucose-Capillary: 112 mg/dL — ABNORMAL HIGH (ref 70–99)
Glucose-Capillary: 89 mg/dL (ref 70–99)

## 2018-09-10 LAB — BASIC METABOLIC PANEL
ANION GAP: 9 (ref 5–15)
Anion gap: 10 (ref 5–15)
BUN: 5 mg/dL — AB (ref 6–20)
BUN: 7 mg/dL (ref 6–20)
CALCIUM: 8.6 mg/dL — AB (ref 8.9–10.3)
CHLORIDE: 94 mmol/L — AB (ref 98–111)
CHLORIDE: 90 mmol/L — AB (ref 98–111)
CO2: 21 mmol/L — AB (ref 22–32)
CO2: 21 mmol/L — ABNORMAL LOW (ref 22–32)
CREATININE: 0.38 mg/dL — AB (ref 0.61–1.24)
Calcium: 8.4 mg/dL — ABNORMAL LOW (ref 8.9–10.3)
Creatinine, Ser: 0.42 mg/dL — ABNORMAL LOW (ref 0.61–1.24)
GFR calc non Af Amer: >60 mL/min (ref >60)
GFR calc non Af Amer: >60 mL/min (ref >60)
Glucose, Bld: 103 mg/dL — ABNORMAL HIGH (ref 70–99)
Glucose, Bld: 106 mg/dL — ABNORMAL HIGH (ref 70–99)
POTASSIUM: 3.9 mmol/L (ref 3.5–5.1)
Potassium: 3.9 mmol/L (ref 3.5–5.1)
SODIUM: 125 mmol/L — AB (ref 135–145)
SODIUM: 120 mmol/L — AB (ref 135–145)

## 2018-09-10 MED ORDER — LORAZEPAM 2 MG/ML IJ SOLN
0.5000 mg | INTRAMUSCULAR | Status: DC | PRN
  Administered 2018-09-10: 0.5 mg via INTRAVENOUS
  Administered 2018-09-10 – 2018-09-21 (×17): 1 mg via INTRAVENOUS
  Filled 2018-09-10 (×18): qty 1

## 2018-09-10 MED ORDER — SODIUM CHLORIDE 0.9 % IV SOLN
INTRAVENOUS | Status: DC
  Administered 2018-09-10: 18:00:00 via INTRAVENOUS

## 2018-09-10 MED ORDER — SODIUM CHLORIDE 1 G PO TABS
1.0000 g | ORAL_TABLET | Freq: Two times a day (BID) | ORAL | Status: DC
  Filled 2018-09-10: qty 1
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
## 2018-09-11 DIAGNOSIS — E43 Unspecified severe protein-calorie malnutrition: Secondary | ICD-10-CM

## 2018-09-11 LAB — GLUCOSE, CAPILLARY
GLUCOSE-CAPILLARY: 114 mg/dL — AB (ref 70–99)
Glucose-Capillary: 110 mg/dL — ABNORMAL HIGH (ref 70–99)
Glucose-Capillary: 102 mg/dL — ABNORMAL HIGH (ref 70–99)
Glucose-Capillary: 101 mg/dL — ABNORMAL HIGH (ref 70–99)

## 2018-09-11 LAB — BASIC METABOLIC PANEL
Anion gap: 8 (ref 5–15)
BUN: 5 mg/dL — AB (ref 6–20)
CALCIUM: 8.8 mg/dL — AB (ref 8.9–10.3)
CO2: 22 mmol/L (ref 22–32)
CREATININE: 0.36 mg/dL — AB (ref 0.61–1.24)
Chloride: 93 mmol/L — ABNORMAL LOW (ref 98–111)
GFR calc non Af Amer: >60 mL/min (ref >60)
Glucose, Bld: 102 mg/dL — ABNORMAL HIGH (ref 70–99)
Potassium: 4 mmol/L (ref 3.5–5.1)
SODIUM: 123 mmol/L — AB (ref 135–145)

## 2018-09-11 MED ORDER — SODIUM CHLORIDE 1 G PO TABS
1.0000 g | ORAL_TABLET | Freq: Three times a day (TID) | ORAL | Status: DC
  Administered 2018-09-11 (×2): 1 g via ORAL
  Filled 2018-09-11 (×4): qty 1
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
## 2018-09-11 NOTE — Social Work (Signed)
Pt continues to have no offers, will reach out to SNF facilities that may be willing to accept LOG for pt. Continue to follow.  Doy Hutching, LCSWA [facility] Clinical Social Work [phone]

## 2018-09-11 NOTE — Progress Notes (Addendum)
Subjective: Patient states name and location  Objective: Vital signs in last 24 hours: Temp:  [97.6 F (36.4 C)-98.7 F (37.1 C)] 97.6 F (36.4 C) (10/07 0400) Pulse Rate:  [83-95] 84 (10/07 0400) Resp:  [15-20] 15 (10/07 0400) BP: (111-144)/(74-105) 144/105 (10/07 0400) SpO2:  [100 %] 100 % (10/07 0400)  Intake/Output from previous day: 10/06 0701 - 10/07 0700 In: 599.3 [P.O.:365; I.V.:234.3] Out: 1300 [Urine:1300] Intake/Output this shift: No intake/output data recorded.  Awake, slow mumbling speech. Episodes of confusion and agitation require restraints/mittens. Intermittently cooperative. MAEW.  Lab Results: No results for input(s): WBC, HGB, HCT, PLT in the last 72 hours. BMET Recent Labs    09/10/18 1851 09/11/18 0309  NA 120* 123*  K 3.9 4.0  CL 90* 93*  CO2 21* 22  GLUCOSE 106* 102*  BUN 7 5*  CREATININE 0.42* 0.36*  CALCIUM 8.4* 8.8*    Studies/Results: Dg Chest Port 1 View  Result Date: 09/10/2018 CLINICAL DATA:  PICC line removal EXAM: PORTABLE CHEST 1 VIEW COMPARISON:  Portable exam 0940 hours compared to 09/06/2018 FINDINGS: Normal heart size, mediastinal contours, and pulmonary vascularity. Previously identified endotracheal tube, nasogastric tube, and RIGHT arm PICC line no longer visualized. EKG leads project over chest. Lungs clear. No pleural effusion or pneumothorax. Displaced fractures of the LEFT fourth fifth sixth seventh and eighth ribs identified. IMPRESSION: Multiple LEFT rib fractures. No acute abnormalities. Electronically Signed   By: Ulyses Southward M.D.   On: 09/10/2018 09:59    Assessment/Plan:   LOS: 16 days  [facility]iva care continues   [name] 09/11/2018, 7:56 AM  Patient is stable. Awaiting SNF placement.
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
## 2018-09-12 LAB — BASIC METABOLIC PANEL
ANION GAP: 9 (ref 5–15)
BUN: 8 mg/dL (ref 6–20)
CHLORIDE: 91 mmol/L — AB (ref 98–111)
CO2: 22 mmol/L (ref 22–32)
Calcium: 9.2 mg/dL (ref 8.9–10.3)
Creatinine, Ser: 0.42 mg/dL — ABNORMAL LOW (ref 0.61–1.24)
GFR calc Af Amer: >60 mL/min (ref >60)
GFR calc non Af Amer: >60 mL/min (ref >60)
GLUCOSE: 97 mg/dL (ref 70–99)
Potassium: 4.3 mmol/L (ref 3.5–5.1)
Sodium: 122 mmol/L — ABNORMAL LOW (ref 135–145)

## 2018-09-12 LAB — GLUCOSE, CAPILLARY
GLUCOSE-CAPILLARY: 100 mg/dL — AB (ref 70–99)
Glucose-Capillary: 104 mg/dL — ABNORMAL HIGH (ref 70–99)
Glucose-Capillary: 108 mg/dL — ABNORMAL HIGH (ref 70–99)
Glucose-Capillary: 110 mg/dL — ABNORMAL HIGH (ref 70–99)
Glucose-Capillary: 116 mg/dL — ABNORMAL HIGH (ref 70–99)
Glucose-Capillary: 96 mg/dL (ref 70–99)
Glucose-Capillary: 97 mg/dL (ref 70–99)

## 2018-09-12 MED ORDER — SODIUM CHLORIDE 1 G PO TABS
2.0000 g | ORAL_TABLET | Freq: Three times a day (TID) | ORAL | Status: DC
  Administered 2018-09-12 – 2018-09-13 (×3): 2 g via ORAL
  Filled 2018-09-12 (×4): qty 2

## 2018-09-12 MED ORDER — POLYETHYLENE GLYCOL 3350 17 G PO PACK
17.0000 g | PACK | Freq: Every day | ORAL | Status: DC
  Administered 2018-09-12 – 2018-09-21 (×8): 17 g via ORAL
  Filled 2018-09-12 (×9): qty 1
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
## 2018-09-12 NOTE — Progress Notes (Addendum)
Subjective: Patient reports "Micheal Medina" "I told them [place], but they said I was wrong"  Objective: Vital signs in last 24 hours: Temp:  [97.6 F (36.4 C)-98.3 F (36.8 C)] 98.3 F (36.8 C) (10/08 1200) Pulse Rate:  [71-92] 71 (10/08 0800) Resp:  [14-19] 14 (10/08 0800) BP: (115-141)/(72-86) 141/83 (10/08 0800) SpO2:  [100 %] 100 % (10/08 0800)  Intake/Output from previous day: 10/07 0701 - 10/08 0700 In: 340 [P.O.:340] Out: 1500 [Urine:1500] Intake/Output this shift: Total I/O In: 240 [P.O.:240] Out: -   Awake, assisted with meal by sitter. Answers questions with some word finding difficulties. Follows some commands. MAEW. Episodes of confusion and agitation require mittens/restraints.  Lab Results: No results for input(s): WBC, HGB, HCT, PLT in the last 72 hours. BMET Recent Labs    09/11/18 0309 09/12/18 0320  NA 123* 122*  K 4.0 4.3  CL 93* 91*  CO2 22 22  GLUCOSE 102* 97  BUN 5* 8  CREATININE 0.36* 0.42*  CALCIUM 8.8* 9.2    Studies/Results: No results found.  Assessment/Plan:   LOS: 17 days  Suportive care continues, awaiting SNF   [name] 09/12/2018, 1:33 PM   Awaiting SNF placement
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
## 2018-09-12 NOTE — Social Work (Addendum)
Followed up with [facility], [facility], and [facility] regarding LOG.  All four facilities reviewing clinicals with their respective administrative offices.  Doy Hutching, LCSWA [facility] Clinical Social Work [phone]
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
## 2018-09-13 LAB — BASIC METABOLIC PANEL
Anion gap: 9 (ref 5–15)
BUN: 10 mg/dL (ref 6–20)
CHLORIDE: 92 mmol/L — AB (ref 98–111)
CO2: 21 mmol/L — ABNORMAL LOW (ref 22–32)
CREATININE: 0.43 mg/dL — AB (ref 0.61–1.24)
Calcium: 9.1 mg/dL (ref 8.9–10.3)
GFR calc Af Amer: >60 mL/min (ref >60)
GLUCOSE: 115 mg/dL — AB (ref 70–99)
POTASSIUM: 4.3 mmol/L (ref 3.5–5.1)
Sodium: 122 mmol/L — ABNORMAL LOW (ref 135–145)

## 2018-09-13 LAB — GLUCOSE, CAPILLARY
GLUCOSE-CAPILLARY: 123 mg/dL — AB (ref 70–99)
GLUCOSE-CAPILLARY: 106 mg/dL — AB (ref 70–99)
Glucose-Capillary: 104 mg/dL — ABNORMAL HIGH (ref 70–99)
Glucose-Capillary: 88 mg/dL (ref 70–99)
Glucose-Capillary: 105 mg/dL — ABNORMAL HIGH (ref 70–99)

## 2018-09-13 MED ORDER — SODIUM CHLORIDE 1 G PO TABS
2.0000 g | ORAL_TABLET | Freq: Four times a day (QID) | ORAL | Status: DC
  Administered 2018-09-13 (×2): 2 g via ORAL
  Filled 2018-09-13 (×5): qty 2
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
## 2018-09-13 NOTE — Progress Notes (Signed)
Subjective: Patient reports "I feel pretty good. I know I'm not in [place]"  Objective: Vital signs in last 24 hours: Temp:  [97.7 F (36.5 C)-99.2 F (37.3 C)] 98.5 F (36.9 C) (10/09 0400) Pulse Rate:  [71-88] 88 (10/08 2000) Resp:  [14-20] 16 (10/09 0630) BP: (111-141)/(80-94) 128/94 (10/09 0630) SpO2:  [100 %] 100 % (10/08 0800) Weight:  [44.3 kg] 44.3 kg (10/08 1700)  Intake/Output from previous day: 10/08 0701 - 10/09 0700 In: 600 [P.O.:600] Out: 1625 [Urine:1625] Intake/Output this shift: No intake/output data recorded.  Awake, oriented to person and place at present. Follows commands slowly, MAEW. Short attention span and periods of confusion require mittens for his safety.  Lab Results: No results for input(s): WBC, HGB, HCT, PLT in the last 72 hours. BMET Recent Labs    09/12/18 0320 09/13/18 0304  NA 122* 122*  K 4.3 4.3  CL 91* 92*  CO2 22 21*  GLUCOSE 97 115*  BUN 8 10  CREATININE 0.42* 0.43*  CALCIUM 9.2 9.1    Studies/Results: No results found.  Assessment/Plan: improving  LOS: 18 days  Supportive care continues, awaiting SNF   Erinn Huskins, Arlys John 09/13/2018, 7:45 AM

## 2018-09-14 LAB — BASIC METABOLIC PANEL
ANION GAP: 8 (ref 5–15)
BUN: 14 mg/dL (ref 6–20)
CALCIUM: 9.2 mg/dL (ref 8.9–10.3)
CO2: 24 mmol/L (ref 22–32)
Chloride: 99 mmol/L (ref 98–111)
Creatinine, Ser: 0.54 mg/dL — ABNORMAL LOW (ref 0.61–1.24)
GFR calc Af Amer: >60 mL/min (ref >60)
Glucose, Bld: 100 mg/dL — ABNORMAL HIGH (ref 70–99)
Potassium: 4.3 mmol/L (ref 3.5–5.1)
Sodium: 131 mmol/L — ABNORMAL LOW (ref 135–145)

## 2018-09-14 LAB — GLUCOSE, CAPILLARY
GLUCOSE-CAPILLARY: 94 mg/dL (ref 70–99)
GLUCOSE-CAPILLARY: 97 mg/dL (ref 70–99)
Glucose-Capillary: 100 mg/dL — ABNORMAL HIGH (ref 70–99)
Glucose-Capillary: 106 mg/dL — ABNORMAL HIGH (ref 70–99)
Glucose-Capillary: 107 mg/dL — ABNORMAL HIGH (ref 70–99)
Glucose-Capillary: 127 mg/dL — ABNORMAL HIGH (ref 70–99)
Glucose-Capillary: 98 mg/dL (ref 70–99)

## 2018-09-14 MED ORDER — SODIUM CHLORIDE 1 G PO TABS
2.0000 g | ORAL_TABLET | Freq: Three times a day (TID) | ORAL | Status: DC
  Administered 2018-09-14 – 2018-09-15 (×5): 2 g via ORAL
  Filled 2018-09-14 (×7): qty 2

## 2018-09-14 NOTE — Social Work (Signed)
CSW spoke with SNFs that take LOGs. They continue to review pt progress- concern from all facilities regarding LTC plan. CSW continue to follow.   Doy Hutching, LCSWA [facility] Clinical Social Work [phone]

## 2018-09-14 NOTE — Progress Notes (Addendum)
Subjective: Patient  mumbles "Haitham" when asked his name  Objective: Vital signs in last 24 hours: Temp:  [97.7 F (36.5 C)-99 F (37.2 C)] 97.7 F (36.5 C) (10/10 0400) Pulse Rate:  [89] 89 (10/09 1147) Resp:  [12-16] 16 (10/10 0600) BP: (115-129)/(76-88) 115/76 (10/10 0600) Weight:  [45.7 kg-45.9 kg] 45.9 kg (10/10 0459)  Intake/Output from previous day: 10/09 0701 - 10/10 0700 In: 680 [P.O.:680] Out: 1200 [Urine:1200] Intake/Output this shift: No intake/output data recorded.  Awake in no distress. Mumbling speech this am in respose to questions. MAEW, but not consistently following commands.   Lab Results: No results for input(s): WBC, HGB, HCT, PLT in the last 72 hours. BMET Recent Labs    09/13/18 0304 09/14/18 0256  NA 122* 131*  K 4.3 4.3  CL 92* 99  CO2 21* 24  GLUCOSE 115* 100*  BUN 10 14  CREATININE 0.43* 0.54*  CALCIUM 9.1 9.2    Studies/Results: No results found.  Assessment/Plan:   LOS: 19 days  supportive care continues.   [name] 09/14/2018, 7:47 AM  Patient is improving
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
## 2018-09-15 LAB — BASIC METABOLIC PANEL
ANION GAP: 8 (ref 5–15)
BUN: 12 mg/dL (ref 6–20)
CALCIUM: 9.2 mg/dL (ref 8.9–10.3)
CO2: 26 mmol/L (ref 22–32)
CREATININE: 0.45 mg/dL — AB (ref 0.61–1.24)
Chloride: 98 mmol/L (ref 98–111)
GFR calc non Af Amer: >60 mL/min (ref >60)
Glucose, Bld: 101 mg/dL — ABNORMAL HIGH (ref 70–99)
Potassium: 3.9 mmol/L (ref 3.5–5.1)
SODIUM: 132 mmol/L — AB (ref 135–145)

## 2018-09-15 LAB — GLUCOSE, CAPILLARY
GLUCOSE-CAPILLARY: 76 mg/dL (ref 70–99)
Glucose-Capillary: 99 mg/dL (ref 70–99)
Glucose-Capillary: 101 mg/dL — ABNORMAL HIGH (ref 70–99)
Glucose-Capillary: 110 mg/dL — ABNORMAL HIGH (ref 70–99)
Glucose-Capillary: 90 mg/dL (ref 70–99)

## 2018-09-15 NOTE — Progress Notes (Addendum)
Subjective: Patient reports "They work just fine" when asked to move his feet.  Objective: Vital signs in last 24 hours: Temp:  [97.9 F (36.6 C)-98.2 F (36.8 C)] 98.1 F (36.7 C) (10/11 0425) Pulse Rate:  [85-88] 85 (10/10 2000) Resp:  [14-16] 14 (10/11 0425) BP: (87-132)/(61-90) 104/90 (10/11 0425) SpO2:  [95 %-98 %] 95 % (10/11 0425) Weight:  [42.6 kg] 42.6 kg (10/11 0425)  Intake/Output from previous day: 10/10 0701 - 10/11 0700 In: 240 [P.O.:240] Out: 1150 [Urine:1150] Intake/Output this shift: No intake/output data recorded.  Awake, talking to himself on nurse's entry. Responds slowly, following commands. Short attention span and confusion persist.   Lab Results: No results for input(s): WBC, HGB, HCT, PLT in the last 72 hours. BMET Recent Labs    09/14/18 0256 09/15/18 0404  NA 131* 132*  K 4.3 3.9  CL 99 98  CO2 24 26  GLUCOSE 100* 101*  BUN 14 12  CREATININE 0.54* 0.45*  CALCIUM 9.2 9.2    Studies/Results: No results found.  Assessment/Plan:   LOS: 20 days  Supportive care continues while awaiting SNF.   [name] 09/15/2018, 7:42 AM   Continuing to await SNF placement
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
## 2018-09-15 NOTE — Social Work (Signed)
Several more denials from SNFs in regards to LOG placement. Following up with [facility] and [place] [facility]. Aware pt medically appropriate for discharge however placement remains a challenge.  Octavio Graves, MSW, [facility] Clinical Social Work [phone]
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
## 2018-09-16 LAB — GLUCOSE, CAPILLARY
Glucose-Capillary: 106 mg/dL — ABNORMAL HIGH (ref 70–99)
Glucose-Capillary: 111 mg/dL — ABNORMAL HIGH (ref 70–99)

## 2018-09-16 LAB — BASIC METABOLIC PANEL
ANION GAP: 9 (ref 5–15)
BUN: 17 mg/dL (ref 6–20)
CO2: 22 mmol/L (ref 22–32)
Calcium: 9.3 mg/dL (ref 8.9–10.3)
Chloride: 104 mmol/L (ref 98–111)
Creatinine, Ser: 0.5 mg/dL — ABNORMAL LOW (ref 0.61–1.24)
GFR calc Af Amer: >60 mL/min (ref >60)
GFR calc non Af Amer: >60 mL/min (ref >60)
GLUCOSE: 96 mg/dL (ref 70–99)
POTASSIUM: 4.1 mmol/L (ref 3.5–5.1)
Sodium: 135 mmol/L (ref 135–145)

## 2018-09-16 MED ORDER — SODIUM CHLORIDE 1 G PO TABS
2.0000 g | ORAL_TABLET | Freq: Two times a day (BID) | ORAL | Status: DC
  Administered 2018-09-16 – 2018-09-18 (×5): 2 g via ORAL
  Filled 2018-09-16 (×6): qty 2
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
## 2018-09-17 LAB — GLUCOSE, CAPILLARY
GLUCOSE-CAPILLARY: 102 mg/dL — AB (ref 70–99)
Glucose-Capillary: 92 mg/dL (ref 70–99)
Glucose-Capillary: 99 mg/dL (ref 70–99)
Glucose-Capillary: 82 mg/dL (ref 70–99)
Glucose-Capillary: 90 mg/dL (ref 70–99)
Glucose-Capillary: 98 mg/dL (ref 70–99)
Glucose-Capillary: 103 mg/dL — ABNORMAL HIGH (ref 70–99)

## 2018-09-17 MED ORDER — METOPROLOL SUCCINATE ER 25 MG PO TB24
12.5000 mg | ORAL_TABLET | Freq: Every day | ORAL | Status: DC
  Administered 2018-09-17 – 2018-09-22 (×6): 12.5 mg via ORAL
  Filled 2018-09-17 (×7): qty 1

## 2018-09-17 NOTE — Progress Notes (Signed)
Patient ID: Micheal Medina, male   DOB: 10-Dec-1960, 57 y.o.   MRN: [id number] BP 105/78 (BP Location: Left Arm)   Pulse 86   Temp 97.8 F (36.6 C) (Axillary)   Resp 16   Ht 5' 2.5" (1.588 m)   Wt 41.3 kg   SpO2 100%   BMI 16.39 kg/m  Lethargic, easily aroused by voice. Oriented to person only Wound is clean, dry, no signs of infection No change in exam, await placement
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
## 2018-09-18 NOTE — Plan of Care (Signed)
  Problem: Health Behavior/Discharge Planning: Goal: Ability to manage health-related needs will improve Outcome: Progressing   Problem: Clinical Measurements: Goal: Will remain free from infection Outcome: Progressing Goal: Diagnostic test results will improve Outcome: Progressing   Problem: Activity: Goal: Risk for activity intolerance will decrease Outcome: Progressing   Problem: Skin Integrity: Goal: Risk for impaired skin integrity will decrease Outcome: Progressing

## 2018-09-18 NOTE — Progress Notes (Addendum)
Subjective: Patient reports Name is "Duvan" "I'm in [place]"  Objective: Vital signs in last 24 hours: Temp:  [97.4 F (36.3 C)-98.8 F (37.1 C)] 98 F (36.7 C) (10/14 0322) Pulse Rate:  [59-98] 59 (10/14 0322) Resp:  [12-16] 12 (10/14 0322) BP: (93-129)/(71-88) 93/71 (10/14 0322) SpO2:  [97 %-100 %] 97 % (10/14 0322) Weight:  [42.4 kg] 42.4 kg (10/14 0322)  Intake/Output from previous day: 10/13 0701 - 10/14 0700 In: 600 [P.O.:600] Out: -  Intake/Output this shift: No intake/output data recorded.  Conversant this morning, answering questions without hesitation. Follows commands all extremities. Good strength against resistance all extremities.   Lab Results: No results for input(s): WBC, HGB, HCT, PLT in the last 72 hours. BMET Recent Labs    09/16/18 0347  NA 135  K 4.1  CL 104  CO2 22  GLUCOSE 96  BUN 17  CREATININE 0.50*  CALCIUM 9.3    Studies/Results: No results found.  Assessment/Plan: improving  LOS: 23 days  Supportive care, awaiting SNF   [place], Arlys John 09/18/2018, 7:30 AM   Patient continues to improve.
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
## 2018-09-19 LAB — BASIC METABOLIC PANEL
Anion gap: 9 (ref 5–15)
BUN: 16 mg/dL (ref 6–20)
CHLORIDE: 107 mmol/L (ref 98–111)
CO2: 24 mmol/L (ref 22–32)
Calcium: 9.5 mg/dL (ref 8.9–10.3)
Creatinine, Ser: 0.46 mg/dL — ABNORMAL LOW (ref 0.61–1.24)
GFR calc Af Amer: >60 mL/min (ref >60)
Glucose, Bld: 102 mg/dL — ABNORMAL HIGH (ref 70–99)
POTASSIUM: 4 mmol/L (ref 3.5–5.1)
SODIUM: 140 mmol/L (ref 135–145)

## 2018-09-19 MED ORDER — POTASSIUM CHLORIDE CRYS ER 10 MEQ PO TBCR
EXTENDED_RELEASE_TABLET | ORAL | Status: AC
  Administered 2018-09-19: 10 meq
  Filled 2018-09-19: qty 1

## 2018-09-19 MED ORDER — ENSURE ENLIVE PO LIQD
237.0000 mL | Freq: Two times a day (BID) | ORAL | Status: DC
  Administered 2018-09-19 – 2018-09-22 (×6): 237 mL via ORAL

## 2018-09-19 MED ORDER — SODIUM CHLORIDE 1 G PO TABS
1.0000 g | ORAL_TABLET | Freq: Two times a day (BID) | ORAL | Status: DC
  Administered 2018-09-19 – 2018-09-22 (×6): 1 g via ORAL
  Filled 2018-09-19 (×7): qty 1
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
## 2018-09-19 NOTE — Progress Notes (Signed)
SLP Cancellation Note  Patient Details Name: Micheal Medina MRN: [id number] DOB: 02/27/1961   Cancelled treatment:       Reason Eval/Treat Not Completed: Fatigue/lethargy limiting ability to participate   [name] Caroline 09/19/2018, 11:50 AM   

## 2018-09-19 NOTE — Progress Notes (Signed)
SLP Cancellation Note  Patient Details Name: Micheal Medina MRN: [id number] DOB: October 05, 1961   Cancelled treatment:       Reason Eval/Treat Not Completed: Fatigue/lethargy limiting ability to participate   Claudine Mouton 09/19/2018, 11:50 AM
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
## 2018-09-19 NOTE — Social Work (Signed)
CSW faxed referral to [facility], pt on "difficult to place" list due to needs and lack of support at home.   Octavio Graves, MSW, [facility] Clinical Social Work [phone]

## 2018-09-19 NOTE — Progress Notes (Addendum)
Subjective: Patient reports "I'm just tired"  Objective: Vital signs in last 24 hours: Temp:  [97.6 F (36.4 C)-99.1 F (37.3 C)] (P) 98.7 F (37.1 C) (10/15 0812) Pulse Rate:  [85-108] 86 (10/15 0810) Resp:  [15-18] 16 (10/15 0810) BP: (96-141)/(70-123) 116/82 (10/15 0810) SpO2:  [97 %-98 %] 98 % (10/15 0810)  Intake/Output from previous day: 10/14 0701 - 10/15 0700 In: 600 [P.O.:600] Out: 1251 [Urine:1250; Stool:1] Intake/Output this shift: Total I/O In: 60 [P.O.:60] Out: -   Sitting in chair. Drowsy, but responds to questions and follows commands.   Lab Results: No results for input(s): WBC, HGB, HCT, PLT in the last 72 hours. BMET Recent Labs    09/19/18 0325  NA 140  K 4.0  CL 107  CO2 24  GLUCOSE 102*  BUN 16  CREATININE 0.46*  CALCIUM 9.5    Studies/Results: No results found.  Assessment/Plan:   LOS: 24 days  Supportive care continues while awaiting SNF   [place]at, Arlys John 09/19/2018, 11:10 AM   Patient is up in chair today, sleepy, but arousable.  Awaiting placement.

## 2018-09-20 LAB — BASIC METABOLIC PANEL
Anion gap: 7 (ref 5–15)
BUN: 15 mg/dL (ref 6–20)
CHLORIDE: 105 mmol/L (ref 98–111)
CO2: 24 mmol/L (ref 22–32)
CREATININE: 0.47 mg/dL — AB (ref 0.61–1.24)
Calcium: 9.6 mg/dL (ref 8.9–10.3)
GFR calc Af Amer: >60 mL/min (ref >60)
GFR calc non Af Amer: >60 mL/min (ref >60)
GLUCOSE: 88 mg/dL (ref 70–99)
Potassium: 3.9 mmol/L (ref 3.5–5.1)
SODIUM: 136 mmol/L (ref 135–145)

## 2018-09-20 NOTE — Social Work (Signed)
CSW attempted to reach pt son at [phone] in regards to pt placement challenges and more distant referrals having to be made. No response, HIPAA compliant message left on answering machine at number listed.  Octavio Graves, MSW, [facility] Clinical Social Work [phone]
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
## 2018-09-21 LAB — BASIC METABOLIC PANEL
ANION GAP: 7 (ref 5–15)
BUN: 15 mg/dL (ref 6–20)
CALCIUM: 9.3 mg/dL (ref 8.9–10.3)
CO2: 25 mmol/L (ref 22–32)
Chloride: 104 mmol/L (ref 98–111)
Creatinine, Ser: 0.56 mg/dL — ABNORMAL LOW (ref 0.61–1.24)
GFR calc Af Amer: >60 mL/min (ref >60)
GFR calc non Af Amer: >60 mL/min (ref >60)
GLUCOSE: 91 mg/dL (ref 70–99)
Potassium: 4.2 mmol/L (ref 3.5–5.1)
Sodium: 136 mmol/L (ref 135–145)

## 2018-09-21 MED ORDER — CLONAZEPAM 0.25 MG PO TBDP: 0.2500 mg | ORAL_TABLET | Freq: Two times a day (BID) | ORAL | 0 refills | Status: AC

## 2018-09-21 MED ORDER — ENSURE ENLIVE PO LIQD: 237.0000 mL | Freq: Two times a day (BID) | ORAL | 12 refills | Status: AC

## 2018-09-21 MED ORDER — METOPROLOL SUCCINATE ER 25 MG PO TB24: 12.5000 mg | ORAL_TABLET | Freq: Every day | ORAL | Status: AC

## 2018-09-21 MED ORDER — SODIUM CHLORIDE 1 G PO TABS: 1.0000 g | ORAL_TABLET | Freq: Two times a day (BID) | ORAL | Status: AC

## 2018-09-21 MED ORDER — RESOURCE THICKENUP CLEAR PO POWD: 1.0000 | ORAL | Status: AC | PRN

## 2018-09-21 MED ORDER — FOLIC ACID 1 MG PO TABS: 1.0000 mg | ORAL_TABLET | Freq: Every day | ORAL | Status: AC

## 2018-09-21 MED ORDER — IBUPROFEN 800 MG PO TABS: 800.0000 mg | ORAL_TABLET | Freq: Three times a day (TID) | ORAL | 0 refills | Status: AC | PRN

## 2018-09-21 MED ORDER — POLYETHYLENE GLYCOL 3350 17 G PO PACK: 17.0000 g | PACK | Freq: Every day | ORAL | 0 refills | Status: AC

## 2018-09-21 MED ORDER — FAMOTIDINE 20 MG PO TABS: 20.0000 mg | ORAL_TABLET | Freq: Two times a day (BID) | ORAL | Status: AC

## 2018-09-21 MED ORDER — QUETIAPINE FUMARATE 100 MG PO TABS: 100.0000 mg | ORAL_TABLET | Freq: Two times a day (BID) | ORAL | 0 refills | Status: AC

## 2018-09-21 MED ORDER — ACETAMINOPHEN 325 MG PO TABS: 650.0000 mg | ORAL_TABLET | ORAL | Status: AC | PRN

## 2018-09-21 MED ORDER — ADULT MULTIVITAMIN W/MINERALS CH: 1.0000 | ORAL_TABLET | Freq: Every day | ORAL | Status: AC

## 2018-09-21 MED ORDER — THIAMINE HCL 100 MG PO TABS: 100.0000 mg | ORAL_TABLET | Freq: Every day | ORAL | Status: AC

## 2018-09-21 MED ORDER — DOCUSATE SODIUM 100 MG PO CAPS: 100.0000 mg | ORAL_CAPSULE | Freq: Two times a day (BID) | ORAL | 0 refills | Status: AC
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
## 2018-09-21 NOTE — Social Work (Addendum)
[facility] has accepted pt on LOG. Continue to attempt to reach pt son to confirm plans and discuss having pt son complete admitting paperwork for pt.   When this has been arranged pt able to discharge to SNF.   1:17pm- Spoke with pt son, see full assessment from this morning, he is agreeable to [facility]. He is coming back to  tonight form [place] and requested SNF paperwork be emailed to him. Aggie Cosier with [facility] will accept 30 day LOG with 2 weeks of therapy, and month by month extension as Medicaid pends. CSW sent pt's son's email- [email or web address] to [facility]. LOG has been given to department head to complete LOG letter and send to facility. SSN and Letter of Altered Mental Status have also been completed and sent to Financial Counseling to assist in submission of Medicaid application.   [facility] will accept pt tomorrow. Will alert care team.   Octavio Graves, MSW, [facility] Clinical Social Work [phone]
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
## 2018-09-22 NOTE — Progress Notes (Signed)
Entered room due to bed alarm going off to find pt standing on side of bed after climbing over bed rail. Pt attempting to get to meal tray and told to stop. Pt tried to take a step, tripping and lunging forward with pt caught and placed in chair with chair alarm on. RN made aware. Call bell in his lap.  Delaney Meigs, PT Acute Rehabilitation Services Pager: [phone] Office: [phone]

## 2018-09-22 NOTE — Progress Notes (Signed)
Patient is ready for discharge. He is alert to self. Vital signs are stable. IV has been removed without complication and catheter intact. Patient will be transported to [facility] in [place], [place] by [place]. Copy of AVS has been provided to [facility].  Called and gave report to Amanda Park, RN with [facility] [phone].

## 2018-09-22 NOTE — Progress Notes (Signed)
Clinical Social Worker facilitated patient discharge including contacting patient family and facility to confirm patient discharge plans.  Clinical information faxed to facility and family agreeable with plan.  CSW arranged ambulance transport via [facility] to [facility] .  RN to call [phone] and ask to speak to nurse that will be accepting Mr. Noga for report prior to discharge.  Clinical Social Worker will sign off for now as social work intervention is no longer needed. Please consult [place] again if new need arises.  Marrianne Mood, MSW, [employer] [phone]
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
## 2018-09-25 ENCOUNTER — Encounter: Payer: Self-pay | Admitting: Gastroenterology
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
